# Patient Record
Sex: Male | Born: 1965 | Race: Black or African American | Hispanic: No | Marital: Married | Smoking: Former smoker
Health system: Southern US, Community
[De-identification: ages and names within clinical notes are randomized; demographics above are authoritative.]

## PROBLEM LIST (undated history)

## (undated) DIAGNOSIS — I209 Angina pectoris, unspecified: Secondary | ICD-10-CM

## (undated) DIAGNOSIS — E785 Hyperlipidemia, unspecified: Secondary | ICD-10-CM

## (undated) DIAGNOSIS — E119 Type 2 diabetes mellitus without complications: Secondary | ICD-10-CM

## (undated) DIAGNOSIS — A048 Other specified bacterial intestinal infections: Secondary | ICD-10-CM

## (undated) DIAGNOSIS — K259 Gastric ulcer, unspecified as acute or chronic, without hemorrhage or perforation: Secondary | ICD-10-CM

## (undated) DIAGNOSIS — R011 Cardiac murmur, unspecified: Secondary | ICD-10-CM

## (undated) DIAGNOSIS — L910 Hypertrophic scar: Secondary | ICD-10-CM

## (undated) DIAGNOSIS — I10 Essential (primary) hypertension: Secondary | ICD-10-CM

## (undated) DIAGNOSIS — G4730 Sleep apnea, unspecified: Secondary | ICD-10-CM

## (undated) HISTORY — PX: FRACTURE SURGERY: SHX138

---

## 2005-08-27 ENCOUNTER — Emergency Department: Payer: Self-pay | Admitting: Emergency Medicine

## 2005-12-18 ENCOUNTER — Emergency Department: Payer: Self-pay | Admitting: Emergency Medicine

## 2007-05-02 ENCOUNTER — Emergency Department: Payer: Self-pay | Admitting: Emergency Medicine

## 2007-10-27 ENCOUNTER — Emergency Department: Payer: Self-pay | Admitting: Emergency Medicine

## 2008-02-01 ENCOUNTER — Emergency Department: Payer: Self-pay | Admitting: Emergency Medicine

## 2009-03-02 ENCOUNTER — Emergency Department: Payer: Self-pay | Admitting: Emergency Medicine

## 2009-11-23 ENCOUNTER — Emergency Department: Payer: Self-pay | Admitting: Emergency Medicine

## 2010-04-05 ENCOUNTER — Emergency Department: Payer: Self-pay | Admitting: Emergency Medicine

## 2010-04-18 ENCOUNTER — Emergency Department: Payer: Self-pay | Admitting: Emergency Medicine

## 2010-10-20 ENCOUNTER — Emergency Department: Payer: Self-pay | Admitting: Emergency Medicine

## 2010-11-12 ENCOUNTER — Ambulatory Visit: Payer: Self-pay | Admitting: Family Medicine

## 2010-11-29 ENCOUNTER — Ambulatory Visit: Payer: Self-pay | Admitting: Family Medicine

## 2011-08-11 ENCOUNTER — Emergency Department: Payer: Self-pay | Admitting: Emergency Medicine

## 2011-09-12 ENCOUNTER — Emergency Department: Payer: Self-pay | Admitting: *Deleted

## 2011-12-27 ENCOUNTER — Emergency Department: Payer: Self-pay | Admitting: Emergency Medicine

## 2012-01-01 ENCOUNTER — Emergency Department: Payer: Self-pay | Admitting: *Deleted

## 2012-01-01 LAB — CBC
HGB: 15.4 g/dL (ref 13.0–18.0)
MCH: 30.2 pg (ref 26.0–34.0)
MCHC: 34.1 g/dL (ref 32.0–36.0)
MCV: 89 fL (ref 80–100)
Platelet: 198 10*3/uL (ref 150–440)
RBC: 5.08 10*6/uL (ref 4.40–5.90)
RDW: 13.4 % (ref 11.5–14.5)
WBC: 6.1 10*3/uL (ref 3.8–10.6)

## 2012-01-01 LAB — COMPREHENSIVE METABOLIC PANEL
Albumin: 3.8 g/dL (ref 3.4–5.0)
BUN: 8 mg/dL (ref 7–18)
Bilirubin,Total: 0.3 mg/dL (ref 0.2–1.0)
Calcium, Total: 8.3 mg/dL — ABNORMAL LOW (ref 8.5–10.1)
Chloride: 108 mmol/L — ABNORMAL HIGH (ref 98–107)
Creatinine: 0.94 mg/dL (ref 0.60–1.30)
EGFR (Non-African Amer.): >60
Glucose: 155 mg/dL — ABNORMAL HIGH (ref 65–99)
Osmolality: 285 (ref 275–301)
Potassium: 3.9 mmol/L (ref 3.5–5.1)
SGOT(AST): 30 U/L (ref 15–37)
Sodium: 142 mmol/L (ref 136–145)

## 2012-01-01 LAB — URINALYSIS, COMPLETE
Blood: NEGATIVE
Ketone: NEGATIVE
Nitrite: NEGATIVE
Ph: 5 (ref 4.5–8.0)
Protein: NEGATIVE
RBC,UR: 2 /HPF (ref 0–5)
Squamous Epithelial: 1

## 2012-01-01 LAB — APTT: Activated PTT: 28.1 secs (ref 23.6–35.9)

## 2012-03-11 ENCOUNTER — Emergency Department: Payer: Self-pay | Admitting: *Deleted

## 2012-03-25 ENCOUNTER — Emergency Department: Payer: Self-pay | Admitting: Emergency Medicine

## 2012-03-25 LAB — BASIC METABOLIC PANEL
Chloride: 106 mmol/L (ref 98–107)
Co2: 27 mmol/L (ref 21–32)
EGFR (African American): >60
EGFR (Non-African Amer.): >60
Glucose: 178 mg/dL — ABNORMAL HIGH (ref 65–99)
Osmolality: 285 (ref 275–301)
Potassium: 3.6 mmol/L (ref 3.5–5.1)
Sodium: 141 mmol/L (ref 136–145)

## 2012-03-25 LAB — CBC
HGB: 14.1 g/dL (ref 13.0–18.0)
MCH: 30.8 pg (ref 26.0–34.0)
MCV: 88 fL (ref 80–100)
Platelet: 194 10*3/uL (ref 150–440)
RBC: 4.57 10*6/uL (ref 4.40–5.90)
RDW: 13.5 % (ref 11.5–14.5)
WBC: 8.4 10*3/uL (ref 3.8–10.6)

## 2012-03-25 LAB — CK TOTAL AND CKMB (NOT AT ARMC): CK, Total: 361 U/L — ABNORMAL HIGH (ref 35–232)

## 2012-05-05 ENCOUNTER — Emergency Department: Payer: Self-pay | Admitting: Internal Medicine

## 2012-05-22 ENCOUNTER — Emergency Department: Payer: Self-pay | Admitting: Emergency Medicine

## 2012-05-30 ENCOUNTER — Emergency Department: Payer: Self-pay | Admitting: *Deleted

## 2012-05-30 LAB — CBC
MCH: 30.9 pg (ref 26.0–34.0)
MCHC: 35.3 g/dL (ref 32.0–36.0)
Platelet: 190 10*3/uL (ref 150–440)
RDW: 13 % (ref 11.5–14.5)

## 2012-05-30 LAB — URINALYSIS, COMPLETE
Bacteria: NONE SEEN
Glucose,UR: NEGATIVE mg/dL (ref 0–75)
Ph: 6 (ref 4.5–8.0)
RBC,UR: 1 /HPF (ref 0–5)
Squamous Epithelial: 1
WBC UR: 4 /HPF (ref 0–5)

## 2012-05-30 LAB — CK TOTAL AND CKMB (NOT AT ARMC)
CK, Total: 608 U/L — ABNORMAL HIGH (ref 35–232)
CK-MB: 2.1 ng/mL (ref 0.5–3.6)

## 2012-05-30 LAB — BASIC METABOLIC PANEL
Anion Gap: 11 (ref 7–16)
BUN: 12 mg/dL (ref 7–18)
Calcium, Total: 8.8 mg/dL (ref 8.5–10.1)
Chloride: 107 mmol/L (ref 98–107)
Co2: 23 mmol/L (ref 21–32)
Creatinine: 1.03 mg/dL (ref 0.60–1.30)
Osmolality: 282 (ref 275–301)
Potassium: 3.9 mmol/L (ref 3.5–5.1)

## 2012-08-07 ENCOUNTER — Emergency Department: Payer: Self-pay | Admitting: Emergency Medicine

## 2012-08-25 ENCOUNTER — Emergency Department: Payer: Self-pay | Admitting: Emergency Medicine

## 2012-08-25 LAB — CBC
HCT: 44.1 % (ref 40.0–52.0)
HGB: 15.2 g/dL (ref 13.0–18.0)
MCV: 87 fL (ref 80–100)
RBC: 5.07 10*6/uL (ref 4.40–5.90)
RDW: 12.8 % (ref 11.5–14.5)

## 2012-08-25 LAB — BASIC METABOLIC PANEL
Anion Gap: 8 (ref 7–16)
Calcium, Total: 8.7 mg/dL (ref 8.5–10.1)
Osmolality: 283 (ref 275–301)
Potassium: 3.6 mmol/L (ref 3.5–5.1)

## 2012-10-31 ENCOUNTER — Emergency Department: Payer: Self-pay | Admitting: Emergency Medicine

## 2012-10-31 LAB — URINALYSIS, COMPLETE
Bacteria: NONE SEEN
Bilirubin,UR: NEGATIVE
Blood: NEGATIVE
Glucose,UR: >=500 mg/dL (ref 0–75)
Ketone: NEGATIVE
Nitrite: NEGATIVE
Protein: NEGATIVE

## 2012-10-31 LAB — CBC
HCT: 44.6 % (ref 40.0–52.0)
HGB: 15 g/dL (ref 13.0–18.0)
MCH: 30 pg (ref 26.0–34.0)
MCHC: 33.5 g/dL (ref 32.0–36.0)
MCV: 89 fL (ref 80–100)

## 2012-10-31 LAB — COMPREHENSIVE METABOLIC PANEL
Albumin: 4.1 g/dL (ref 3.4–5.0)
Alkaline Phosphatase: 134 U/L (ref 50–136)
Anion Gap: 7 (ref 7–16)
Calcium, Total: 8.8 mg/dL (ref 8.5–10.1)
Chloride: 104 mmol/L (ref 98–107)
Co2: 26 mmol/L (ref 21–32)
EGFR (African American): >60
EGFR (Non-African Amer.): >60
Glucose: 202 mg/dL — ABNORMAL HIGH (ref 65–99)
Osmolality: 279 (ref 275–301)
Potassium: 3.9 mmol/L (ref 3.5–5.1)
Sodium: 137 mmol/L (ref 136–145)
Total Protein: 8.1 g/dL (ref 6.4–8.2)

## 2012-11-21 ENCOUNTER — Emergency Department: Payer: Self-pay | Admitting: Emergency Medicine

## 2012-11-21 LAB — URINALYSIS, COMPLETE
Bacteria: NONE SEEN
Bilirubin,UR: NEGATIVE
Ketone: NEGATIVE
Ph: 5 (ref 4.5–8.0)
Protein: NEGATIVE
Squamous Epithelial: <1
WBC UR: 7 /HPF (ref 0–5)

## 2012-11-21 LAB — LIPASE, BLOOD: Lipase: 126 U/L (ref 73–393)

## 2012-11-21 LAB — CBC
HCT: 42.9 % (ref 40.0–52.0)
MCHC: 34.2 g/dL (ref 32.0–36.0)
MCV: 88 fL (ref 80–100)
Platelet: 185 10*3/uL (ref 150–440)
RBC: 4.85 10*6/uL (ref 4.40–5.90)

## 2012-11-21 LAB — COMPREHENSIVE METABOLIC PANEL
Albumin: 3.6 g/dL (ref 3.4–5.0)
Anion Gap: 3 — ABNORMAL LOW (ref 7–16)
Chloride: 105 mmol/L (ref 98–107)
Creatinine: 0.98 mg/dL (ref 0.60–1.30)
EGFR (Non-African Amer.): >60
Glucose: 116 mg/dL — ABNORMAL HIGH (ref 65–99)
Osmolality: 276 (ref 275–301)
Potassium: 4.3 mmol/L (ref 3.5–5.1)
SGOT(AST): 31 U/L (ref 15–37)
SGPT (ALT): 54 U/L (ref 12–78)
Sodium: 138 mmol/L (ref 136–145)

## 2012-12-03 ENCOUNTER — Emergency Department: Payer: Self-pay | Admitting: Emergency Medicine

## 2012-12-03 LAB — CBC
HCT: 43.9 % (ref 40.0–52.0)
HGB: 15.1 g/dL (ref 13.0–18.0)
MCHC: 34.4 g/dL (ref 32.0–36.0)
MCV: 88 fL (ref 80–100)
RBC: 4.97 10*6/uL (ref 4.40–5.90)
WBC: 10.5 10*3/uL (ref 3.8–10.6)

## 2012-12-03 LAB — COMPREHENSIVE METABOLIC PANEL
Albumin: 3.8 g/dL (ref 3.4–5.0)
Bilirubin,Total: 0.2 mg/dL (ref 0.2–1.0)
Co2: 22 mmol/L (ref 21–32)
EGFR (Non-African Amer.): >60
Osmolality: 277 (ref 275–301)
Potassium: 3.8 mmol/L (ref 3.5–5.1)
Sodium: 138 mmol/L (ref 136–145)
Total Protein: 7.6 g/dL (ref 6.4–8.2)

## 2012-12-03 LAB — URINALYSIS, COMPLETE
Bacteria: NONE SEEN
Bilirubin,UR: NEGATIVE
Glucose,UR: NEGATIVE mg/dL (ref 0–75)
Nitrite: NEGATIVE
Ph: 6 (ref 4.5–8.0)
WBC UR: <1 /HPF (ref 0–5)

## 2012-12-03 LAB — LIPASE, BLOOD: Lipase: 104 U/L (ref 73–393)

## 2013-02-02 ENCOUNTER — Emergency Department: Payer: Self-pay | Admitting: Emergency Medicine

## 2013-02-02 LAB — BASIC METABOLIC PANEL
Anion Gap: 9 (ref 7–16)
BUN: 14 mg/dL (ref 7–18)
Co2: 26 mmol/L (ref 21–32)
Creatinine: 1 mg/dL (ref 0.60–1.30)
EGFR (African American): >60
EGFR (Non-African Amer.): >60
Osmolality: 288 (ref 275–301)
Sodium: 137 mmol/L (ref 136–145)

## 2013-02-02 LAB — CBC WITH DIFFERENTIAL/PLATELET
Basophil #: 0.1 10*3/uL (ref 0.0–0.1)
Basophil %: 1.1 %
HCT: 41 % (ref 40.0–52.0)
Lymphocyte #: 2.6 10*3/uL (ref 1.0–3.6)
Lymphocyte %: 32 %
MCV: 86 fL (ref 80–100)
Monocyte #: 0.9 x10 3/mm (ref 0.2–1.0)
Monocyte %: 11 %
Neutrophil #: 4.4 10*3/uL (ref 1.4–6.5)
Neutrophil %: 54 %
Platelet: 177 10*3/uL (ref 150–440)
RBC: 4.75 10*6/uL (ref 4.40–5.90)
RDW: 12.7 % (ref 11.5–14.5)
WBC: 8.2 10*3/uL (ref 3.8–10.6)

## 2013-02-02 LAB — URINALYSIS, COMPLETE
Bilirubin,UR: NEGATIVE
Blood: NEGATIVE
Glucose,UR: >=500 mg/dL (ref 0–75)
Ketone: NEGATIVE
Nitrite: NEGATIVE
Ph: 6 (ref 4.5–8.0)
Protein: NEGATIVE
Specific Gravity: 1.025 (ref 1.003–1.030)
Squamous Epithelial: NONE SEEN

## 2013-02-17 ENCOUNTER — Emergency Department: Payer: Self-pay | Admitting: Emergency Medicine

## 2013-02-17 LAB — COMPREHENSIVE METABOLIC PANEL
Alkaline Phosphatase: 207 U/L — ABNORMAL HIGH (ref 50–136)
Anion Gap: 5 — ABNORMAL LOW (ref 7–16)
Calcium, Total: 8.5 mg/dL (ref 8.5–10.1)
Chloride: 100 mmol/L (ref 98–107)
Co2: 29 mmol/L (ref 21–32)
Creatinine: 1.08 mg/dL (ref 0.60–1.30)
EGFR (African American): >60
Osmolality: 287 (ref 275–301)
Total Protein: 7.3 g/dL (ref 6.4–8.2)

## 2013-02-17 LAB — URINALYSIS, COMPLETE
Blood: NEGATIVE
Ketone: NEGATIVE
Nitrite: NEGATIVE
Ph: 6 (ref 4.5–8.0)
Protein: NEGATIVE
RBC,UR: <1 /HPF (ref 0–5)
Specific Gravity: 1.023 (ref 1.003–1.030)
Squamous Epithelial: <1

## 2013-02-17 LAB — CBC
HCT: 40.8 % (ref 40.0–52.0)
HGB: 14 g/dL (ref 13.0–18.0)
MCHC: 34.3 g/dL (ref 32.0–36.0)
Platelet: 164 10*3/uL (ref 150–440)
RBC: 4.66 10*6/uL (ref 4.40–5.90)
RDW: 12.8 % (ref 11.5–14.5)
WBC: 7.4 10*3/uL (ref 3.8–10.6)

## 2013-03-14 ENCOUNTER — Ambulatory Visit: Payer: Self-pay | Admitting: Endocrinology

## 2013-03-22 ENCOUNTER — Ambulatory Visit: Payer: Self-pay | Admitting: Endocrinology

## 2013-05-13 ENCOUNTER — Emergency Department: Payer: Self-pay | Admitting: Emergency Medicine

## 2013-05-13 LAB — CBC
HCT: 43.4 % (ref 40.0–52.0)
HGB: 15.2 g/dL (ref 13.0–18.0)
MCH: 29.7 pg (ref 26.0–34.0)
MCV: 85 fL (ref 80–100)
Platelet: 166 10*3/uL (ref 150–440)
RBC: 5.13 10*6/uL (ref 4.40–5.90)
RDW: 13.2 % (ref 11.5–14.5)
WBC: 7.5 10*3/uL (ref 3.8–10.6)

## 2013-05-13 LAB — URINALYSIS, COMPLETE
Blood: NEGATIVE
Glucose,UR: >=500 mg/dL (ref 0–75)
Leukocyte Esterase: NEGATIVE
Ph: 5 (ref 4.5–8.0)
Protein: NEGATIVE
RBC,UR: NONE SEEN /HPF (ref 0–5)
Specific Gravity: 1.021 (ref 1.003–1.030)
Squamous Epithelial: NONE SEEN
WBC UR: 1 /HPF (ref 0–5)

## 2013-05-13 LAB — COMPREHENSIVE METABOLIC PANEL
Albumin: 4.2 g/dL (ref 3.4–5.0)
Alkaline Phosphatase: 221 U/L — ABNORMAL HIGH (ref 50–136)
BUN: 15 mg/dL (ref 7–18)
Bilirubin,Total: 0.4 mg/dL (ref 0.2–1.0)
Chloride: 101 mmol/L (ref 98–107)
Creatinine: 0.91 mg/dL (ref 0.60–1.30)
EGFR (African American): >60
SGOT(AST): 33 U/L (ref 15–37)
Sodium: 131 mmol/L — ABNORMAL LOW (ref 136–145)
Total Protein: 7.8 g/dL (ref 6.4–8.2)

## 2013-05-27 ENCOUNTER — Emergency Department: Payer: Self-pay | Admitting: Emergency Medicine

## 2013-05-27 LAB — COMPREHENSIVE METABOLIC PANEL
Alkaline Phosphatase: 246 U/L — ABNORMAL HIGH (ref 50–136)
Anion Gap: 8 (ref 7–16)
BUN: 13 mg/dL (ref 7–18)
Bilirubin,Total: 0.4 mg/dL (ref 0.2–1.0)
Calcium, Total: 8.8 mg/dL (ref 8.5–10.1)
Chloride: 98 mmol/L (ref 98–107)
Co2: 24 mmol/L (ref 21–32)
Creatinine: 0.99 mg/dL (ref 0.60–1.30)
EGFR (African American): >60
EGFR (Non-African Amer.): >60
Glucose: 560 mg/dL (ref 65–99)
Osmolality: 287 (ref 275–301)
Potassium: 4.1 mmol/L (ref 3.5–5.1)
SGOT(AST): 20 U/L (ref 15–37)
SGPT (ALT): 42 U/L (ref 12–78)
Sodium: 130 mmol/L — ABNORMAL LOW (ref 136–145)

## 2013-05-27 LAB — CBC
HCT: 44.2 % (ref 40.0–52.0)
MCV: 86 fL (ref 80–100)
Platelet: 170 10*3/uL (ref 150–440)
RBC: 5.12 10*6/uL (ref 4.40–5.90)
WBC: 7.9 10*3/uL (ref 3.8–10.6)

## 2013-05-31 ENCOUNTER — Ambulatory Visit: Payer: Self-pay | Admitting: Family Medicine

## 2013-06-27 ENCOUNTER — Ambulatory Visit: Payer: Self-pay | Admitting: Family Medicine

## 2013-07-04 ENCOUNTER — Emergency Department: Payer: Self-pay | Admitting: Emergency Medicine

## 2013-07-04 LAB — BASIC METABOLIC PANEL
Anion Gap: 8 (ref 7–16)
BUN: 16 mg/dL (ref 7–18)
Calcium, Total: 8.6 mg/dL (ref 8.5–10.1)
Chloride: 100 mmol/L (ref 98–107)
Creatinine: 0.89 mg/dL (ref 0.60–1.30)
EGFR (African American): >60
EGFR (Non-African Amer.): >60
Osmolality: 287 (ref 275–301)

## 2013-07-04 LAB — CBC
HGB: 15.2 g/dL (ref 13.0–18.0)
MCHC: 34.2 g/dL (ref 32.0–36.0)
Platelet: 186 10*3/uL (ref 150–440)
RDW: 12.8 % (ref 11.5–14.5)
WBC: 8.2 10*3/uL (ref 3.8–10.6)

## 2013-07-04 LAB — TROPONIN I
Troponin-I: <0.02 ng/mL
Troponin-I: <0.02 ng/mL

## 2013-07-15 ENCOUNTER — Ambulatory Visit: Payer: Self-pay | Admitting: Family Medicine

## 2013-08-15 ENCOUNTER — Ambulatory Visit: Payer: Self-pay | Admitting: Family Medicine

## 2013-08-24 ENCOUNTER — Emergency Department: Payer: Self-pay | Admitting: Emergency Medicine

## 2013-08-24 LAB — COMPREHENSIVE METABOLIC PANEL
ALK PHOS: 205 U/L — AB
Albumin: 3.7 g/dL (ref 3.4–5.0)
Anion Gap: 3 — ABNORMAL LOW (ref 7–16)
BUN: 12 mg/dL (ref 7–18)
Bilirubin,Total: 0.5 mg/dL (ref 0.2–1.0)
CALCIUM: 8.6 mg/dL (ref 8.5–10.1)
Chloride: 99 mmol/L (ref 98–107)
Co2: 30 mmol/L (ref 21–32)
Creatinine: 1.11 mg/dL (ref 0.60–1.30)
EGFR (African American): >60
GLUCOSE: 485 mg/dL — AB (ref 65–99)
Osmolality: 286 (ref 275–301)
Potassium: 4.9 mmol/L (ref 3.5–5.1)
SGOT(AST): 18 U/L (ref 15–37)
SGPT (ALT): 39 U/L (ref 12–78)
SODIUM: 132 mmol/L — AB (ref 136–145)
TOTAL PROTEIN: 7.6 g/dL (ref 6.4–8.2)

## 2013-08-24 LAB — URINALYSIS, COMPLETE
Bacteria: NONE SEEN
Bilirubin,UR: NEGATIVE
Blood: NEGATIVE
Glucose,UR: >=500 mg/dL (ref 0–75)
Leukocyte Esterase: NEGATIVE
NITRITE: NEGATIVE
PH: 6 (ref 4.5–8.0)
Protein: NEGATIVE
RBC,UR: NONE SEEN /HPF (ref 0–5)
Specific Gravity: 1.023 (ref 1.003–1.030)
WBC UR: 1 /HPF (ref 0–5)

## 2013-08-24 LAB — CBC
HCT: 45.1 % (ref 40.0–52.0)
HGB: 15.4 g/dL (ref 13.0–18.0)
MCH: 29.1 pg (ref 26.0–34.0)
MCHC: 34.2 g/dL (ref 32.0–36.0)
MCV: 85 fL (ref 80–100)
Platelet: 159 10*3/uL (ref 150–440)
RBC: 5.3 10*6/uL (ref 4.40–5.90)
RDW: 13.1 % (ref 11.5–14.5)
WBC: 6.5 10*3/uL (ref 3.8–10.6)

## 2013-09-05 ENCOUNTER — Ambulatory Visit: Payer: Self-pay | Admitting: Family Medicine

## 2013-09-15 ENCOUNTER — Ambulatory Visit: Payer: Self-pay | Admitting: Family Medicine

## 2013-10-06 ENCOUNTER — Emergency Department: Payer: Self-pay | Admitting: Emergency Medicine

## 2013-10-06 LAB — BASIC METABOLIC PANEL
ANION GAP: 6 — AB (ref 7–16)
BUN: 17 mg/dL (ref 7–18)
CALCIUM: 8 mg/dL — AB (ref 8.5–10.1)
CO2: 25 mmol/L (ref 21–32)
Chloride: 99 mmol/L (ref 98–107)
Creatinine: 1.05 mg/dL (ref 0.60–1.30)
EGFR (African American): >60
EGFR (Non-African Amer.): >60
GLUCOSE: 537 mg/dL — AB (ref 65–99)
OSMOLALITY: 287 (ref 275–301)
POTASSIUM: 4 mmol/L (ref 3.5–5.1)
Sodium: 130 mmol/L — ABNORMAL LOW (ref 136–145)

## 2013-10-06 LAB — CBC
HCT: 41.7 % (ref 40.0–52.0)
HGB: 13.9 g/dL (ref 13.0–18.0)
MCH: 29.3 pg (ref 26.0–34.0)
MCHC: 33.3 g/dL (ref 32.0–36.0)
MCV: 88 fL (ref 80–100)
PLATELETS: 153 10*3/uL (ref 150–440)
RBC: 4.75 10*6/uL (ref 4.40–5.90)
RDW: 13.3 % (ref 11.5–14.5)
WBC: 6.9 10*3/uL (ref 3.8–10.6)

## 2013-10-06 LAB — TROPONIN I: Troponin-I: <0.02 ng/mL

## 2013-10-07 LAB — TROPONIN I

## 2013-10-25 ENCOUNTER — Ambulatory Visit: Payer: Self-pay | Admitting: Gastroenterology

## 2013-11-08 DIAGNOSIS — L910 Hypertrophic scar: Secondary | ICD-10-CM | POA: Insufficient documentation

## 2013-11-22 DIAGNOSIS — E785 Hyperlipidemia, unspecified: Secondary | ICD-10-CM | POA: Insufficient documentation

## 2013-11-22 DIAGNOSIS — E119 Type 2 diabetes mellitus without complications: Secondary | ICD-10-CM | POA: Insufficient documentation

## 2013-11-22 DIAGNOSIS — G4733 Obstructive sleep apnea (adult) (pediatric): Secondary | ICD-10-CM | POA: Insufficient documentation

## 2014-01-23 ENCOUNTER — Emergency Department: Payer: Self-pay | Admitting: Emergency Medicine

## 2014-01-23 LAB — URINALYSIS, COMPLETE
BILIRUBIN, UR: NEGATIVE
Bacteria: NONE SEEN
Blood: NEGATIVE
Leukocyte Esterase: NEGATIVE
NITRITE: NEGATIVE
PH: 6 (ref 4.5–8.0)
Protein: NEGATIVE
RBC,UR: NONE SEEN /HPF (ref 0–5)
SQUAMOUS EPITHELIAL: NONE SEEN
Specific Gravity: 1.009 (ref 1.003–1.030)
WBC UR: 1 /HPF (ref 0–5)

## 2014-01-23 LAB — CBC
HCT: 42.8 % (ref 40.0–52.0)
HGB: 14.9 g/dL (ref 13.0–18.0)
MCH: 29.9 pg (ref 26.0–34.0)
MCHC: 34.7 g/dL (ref 32.0–36.0)
MCV: 86 fL (ref 80–100)
Platelet: 168 10*3/uL (ref 150–440)
RBC: 4.97 10*6/uL (ref 4.40–5.90)
RDW: 13.3 % (ref 11.5–14.5)
WBC: 9.2 10*3/uL (ref 3.8–10.6)

## 2014-01-23 LAB — BASIC METABOLIC PANEL
Anion Gap: 5 — ABNORMAL LOW (ref 7–16)
BUN: 14 mg/dL (ref 7–18)
CO2: 27 mmol/L (ref 21–32)
Calcium, Total: 9 mg/dL (ref 8.5–10.1)
Chloride: 104 mmol/L (ref 98–107)
Creatinine: 0.94 mg/dL (ref 0.60–1.30)
EGFR (Non-African Amer.): >60
Glucose: 187 mg/dL — ABNORMAL HIGH (ref 65–99)
OSMOLALITY: 277 (ref 275–301)
Potassium: 3.5 mmol/L (ref 3.5–5.1)
Sodium: 136 mmol/L (ref 136–145)

## 2014-01-23 LAB — MAGNESIUM: MAGNESIUM: 1.6 mg/dL — AB

## 2014-01-27 DIAGNOSIS — R252 Cramp and spasm: Secondary | ICD-10-CM | POA: Insufficient documentation

## 2014-03-08 ENCOUNTER — Emergency Department: Payer: Self-pay | Admitting: Internal Medicine

## 2014-03-08 LAB — CBC
HCT: 41.1 % (ref 40.0–52.0)
HGB: 13.8 g/dL (ref 13.0–18.0)
MCH: 29.8 pg (ref 26.0–34.0)
MCHC: 33.6 g/dL (ref 32.0–36.0)
MCV: 89 fL (ref 80–100)
Platelet: 167 10*3/uL (ref 150–440)
RBC: 4.63 10*6/uL (ref 4.40–5.90)
RDW: 13.2 % (ref 11.5–14.5)
WBC: 6.3 10*3/uL (ref 3.8–10.6)

## 2014-03-08 LAB — URINALYSIS, COMPLETE
BILIRUBIN, UR: NEGATIVE
BLOOD: NEGATIVE
Bacteria: NONE SEEN
Leukocyte Esterase: NEGATIVE
Nitrite: NEGATIVE
PH: 6 (ref 4.5–8.0)
Protein: NEGATIVE
Specific Gravity: 1.03 (ref 1.003–1.030)
WBC UR: 2 /HPF (ref 0–5)

## 2014-03-08 LAB — BASIC METABOLIC PANEL
ANION GAP: 10 (ref 7–16)
BUN: 13 mg/dL (ref 7–18)
Calcium, Total: 8.3 mg/dL — ABNORMAL LOW (ref 8.5–10.1)
Chloride: 103 mmol/L (ref 98–107)
Co2: 21 mmol/L (ref 21–32)
Creatinine: 1.01 mg/dL (ref 0.60–1.30)
EGFR (Non-African Amer.): >60
Glucose: 447 mg/dL — ABNORMAL HIGH (ref 65–99)
Osmolality: 288 (ref 275–301)
POTASSIUM: 3.7 mmol/L (ref 3.5–5.1)
SODIUM: 134 mmol/L — AB (ref 136–145)

## 2014-03-08 LAB — CK TOTAL AND CKMB (NOT AT ARMC)
CK, TOTAL: 1467 U/L — AB
CK-MB: 1.8 ng/mL (ref 0.5–3.6)

## 2014-03-08 LAB — MAGNESIUM: Magnesium: 1.6 mg/dL — ABNORMAL LOW

## 2014-03-08 LAB — TROPONIN I

## 2014-05-23 ENCOUNTER — Emergency Department: Payer: Self-pay | Admitting: Internal Medicine

## 2014-05-23 LAB — URINALYSIS, COMPLETE
BILIRUBIN, UR: NEGATIVE
BLOOD: NEGATIVE
Bacteria: NONE SEEN
GLUCOSE, UR: NEGATIVE mg/dL (ref 0–75)
KETONE: NEGATIVE
LEUKOCYTE ESTERASE: NEGATIVE
Nitrite: NEGATIVE
Ph: 6 (ref 4.5–8.0)
Protein: NEGATIVE
RBC,UR: <1 /HPF (ref 0–5)
SPECIFIC GRAVITY: 1.02 (ref 1.003–1.030)
Squamous Epithelial: 1
WBC UR: 2 /HPF (ref 0–5)

## 2014-05-23 LAB — CBC
HCT: 44.4 % (ref 40.0–52.0)
HGB: 14.2 g/dL (ref 13.0–18.0)
MCH: 28.7 pg (ref 26.0–34.0)
MCHC: 32 g/dL (ref 32.0–36.0)
MCV: 90 fL (ref 80–100)
Platelet: 159 10*3/uL (ref 150–440)
RBC: 4.94 10*6/uL (ref 4.40–5.90)
RDW: 13 % (ref 11.5–14.5)
WBC: 7.3 10*3/uL (ref 3.8–10.6)

## 2014-05-23 LAB — COMPREHENSIVE METABOLIC PANEL
ALK PHOS: 98 U/L
ANION GAP: 6 — AB (ref 7–16)
Albumin: 3.3 g/dL — ABNORMAL LOW (ref 3.4–5.0)
BILIRUBIN TOTAL: 0.5 mg/dL (ref 0.2–1.0)
BUN: 11 mg/dL (ref 7–18)
CALCIUM: 7.8 mg/dL — AB (ref 8.5–10.1)
Chloride: 106 mmol/L (ref 98–107)
Co2: 29 mmol/L (ref 21–32)
Creatinine: 0.86 mg/dL (ref 0.60–1.30)
EGFR (African American): >60
EGFR (Non-African Amer.): >60
Glucose: 144 mg/dL — ABNORMAL HIGH (ref 65–99)
Osmolality: 283 (ref 275–301)
Potassium: 3.9 mmol/L (ref 3.5–5.1)
SGOT(AST): 28 U/L (ref 15–37)
SGPT (ALT): 30 U/L
Sodium: 141 mmol/L (ref 136–145)
Total Protein: 6.9 g/dL (ref 6.4–8.2)

## 2014-05-23 LAB — TSH: THYROID STIMULATING HORM: 0.63 u[IU]/mL

## 2014-05-23 LAB — DRUG SCREEN, URINE

## 2014-05-23 LAB — TROPONIN I: Troponin-I: <0.02 ng/mL

## 2014-05-23 LAB — CK TOTAL AND CKMB (NOT AT ARMC)
CK, Total: 286 U/L
CK-MB: 1.2 ng/mL (ref 0.5–3.6)

## 2014-05-24 ENCOUNTER — Emergency Department: Payer: Self-pay | Admitting: Emergency Medicine

## 2014-05-28 LAB — CULTURE, BLOOD (SINGLE)

## 2014-06-18 ENCOUNTER — Emergency Department: Payer: Self-pay | Admitting: Emergency Medicine

## 2014-07-30 DIAGNOSIS — M722 Plantar fascial fibromatosis: Secondary | ICD-10-CM | POA: Insufficient documentation

## 2014-09-23 ENCOUNTER — Emergency Department: Payer: Self-pay | Admitting: Emergency Medicine

## 2014-10-06 ENCOUNTER — Ambulatory Visit: Payer: Self-pay | Admitting: Internal Medicine

## 2014-10-18 ENCOUNTER — Emergency Department: Payer: Self-pay | Admitting: Emergency Medicine

## 2014-12-01 ENCOUNTER — Emergency Department: Admit: 2014-12-01 | Discharge status: Against Medical Advice | Payer: Self-pay | Admitting: Emergency Medicine

## 2014-12-01 LAB — CBC
HCT: 44.4 % (ref 40.0–52.0)
HGB: 15.4 g/dL (ref 13.0–18.0)
MCH: 29.6 pg (ref 26.0–34.0)
MCHC: 34.6 g/dL (ref 32.0–36.0)
MCV: 86 fL (ref 80–100)
PLATELETS: 170 10*3/uL (ref 150–440)
RBC: 5.19 10*6/uL (ref 4.40–5.90)
RDW: 13.1 % (ref 11.5–14.5)
WBC: 7.6 10*3/uL (ref 3.8–10.6)

## 2014-12-01 LAB — BASIC METABOLIC PANEL
ANION GAP: 11 (ref 7–16)
BUN: 14 mg/dL
Calcium, Total: 9.1 mg/dL
Chloride: 102 mmol/L
Co2: 21 mmol/L — ABNORMAL LOW
Creatinine: 0.94 mg/dL
GLUCOSE: 338 mg/dL — AB
Potassium: 3.3 mmol/L — ABNORMAL LOW
Sodium: 134 mmol/L — ABNORMAL LOW

## 2014-12-01 LAB — TROPONIN I

## 2015-02-23 ENCOUNTER — Emergency Department
Admission: EM | Admit: 2015-02-23 | Discharge: 2015-02-23 | Disposition: A | Discharge status: Home / Self Care | Payer: BLUE CROSS/BLUE SHIELD | Attending: Emergency Medicine | Admitting: Emergency Medicine

## 2015-02-23 ENCOUNTER — Encounter: Payer: Self-pay | Admitting: *Deleted

## 2015-02-23 DIAGNOSIS — X58XXXA Exposure to other specified factors, initial encounter: Secondary | ICD-10-CM | POA: Diagnosis not present

## 2015-02-23 DIAGNOSIS — Y9289 Other specified places as the place of occurrence of the external cause: Secondary | ICD-10-CM | POA: Diagnosis not present

## 2015-02-23 DIAGNOSIS — Z87891 Personal history of nicotine dependence: Secondary | ICD-10-CM | POA: Diagnosis not present

## 2015-02-23 DIAGNOSIS — T1591XA Foreign body on external eye, part unspecified, right eye, initial encounter: Secondary | ICD-10-CM | POA: Insufficient documentation

## 2015-02-23 DIAGNOSIS — Y9389 Activity, other specified: Secondary | ICD-10-CM | POA: Diagnosis not present

## 2015-02-23 DIAGNOSIS — E119 Type 2 diabetes mellitus without complications: Secondary | ICD-10-CM | POA: Diagnosis not present

## 2015-02-23 DIAGNOSIS — Z79899 Other long term (current) drug therapy: Secondary | ICD-10-CM | POA: Insufficient documentation

## 2015-02-23 DIAGNOSIS — Y998 Other external cause status: Secondary | ICD-10-CM | POA: Diagnosis not present

## 2015-02-23 DIAGNOSIS — Z794 Long term (current) use of insulin: Secondary | ICD-10-CM | POA: Insufficient documentation

## 2015-02-23 HISTORY — DX: Type 2 diabetes mellitus without complications: E11.9

## 2015-02-23 HISTORY — DX: Cardiac murmur, unspecified: R01.1

## 2015-02-23 MED ORDER — ERYTHROMYCIN 5 MG/GM OP OINT
TOPICAL_OINTMENT | Freq: Once | OPHTHALMIC | Status: AC
  Administered 2015-02-23: 1 via OPHTHALMIC
  Filled 2015-02-23: qty 1

## 2015-02-23 MED ORDER — TETRACAINE HCL 0.5 % OP SOLN
2.0000 [drp] | Freq: Once | OPHTHALMIC | Status: AC
  Administered 2015-02-23: 2 [drp] via OPHTHALMIC
  Filled 2015-02-23: qty 2

## 2015-02-23 MED ORDER — FLUORESCEIN SODIUM 1 MG OP STRP
1.0000 | ORAL_STRIP | Freq: Once | OPHTHALMIC | Status: AC
  Administered 2015-02-23: 1 via OPHTHALMIC
  Filled 2015-02-23: qty 1

## 2015-02-23 NOTE — ED Provider Notes (Signed)
[facility] Emergency Department Provider Note  ____________________________________________  Time seen: 2312  I have reviewed the triage vital signs and the nursing notes.   HISTORY  Chief Complaint Foreign Body in Eye     HPI Miguel Walters is a 49 y.o. male who reports he has the sensation of having a foreign body in his right eye. He was lying in bed when he says he thinks some sort of "grit" got in the eye. He was not doing anything specific or exposed to any particular foreign body or contaminant. This began this evening. 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 The pain has continued. He has been rubbing his eye. He reports he feels as though the foreign body is still present.  He reports some blurriness with the right eye.    Past Medical History  Diagnosis Date  . Diabetes mellitus without complication   . Heart murmur     There are no active problems to display for this patient.   History reviewed. No pertinent past surgical history.  Current Outpatient Rx  Name  Route  Sig  Dispense  Refill  . cholecalciferol (VITAMIN D) 1000 UNITS tablet   Oral   Take 1,000 Units by mouth daily.         . insulin detemir (LEVEMIR) 100 UNIT/ML injection   Subcutaneous   Inject into the skin at bedtime.         . insulin lispro (HUMALOG) 100 UNIT/ML injection   Subcutaneous   Inject into the skin 3 (three) times daily before meals.         Marland Kitchen lisinopril (PRINIVIL,ZESTRIL) 10 MG tablet   Oral   Take 10 mg by mouth daily.         . metFORMIN (GLUCOPHAGE) 500 MG tablet   Oral   Take 500 mg by mouth 2 (two) times daily with a meal.           Allergies Hydrocodone  History reviewed. No pertinent family history.  Social History History  Substance Use Topics  . Smoking status: Former Smoker    Quit date: 02/23/2007  . Smokeless tobacco: Not on file  . Alcohol Use: No    Review of  Systems  Constitutional: Negative for fever. ENT: Foreign body sensation to right eye. Cardiovascular: Negative for chest pain. Respiratory: Negative for shortness of breath. Gastrointestinal: Negative for abdominal pain, vomiting and diarrhea. Genitourinary: Negative for dysuria. Musculoskeletal: No myalgias or injuries. Skin: Negative for rash. Neurological: Negative for headaches   10-point ROS otherwise negative.  ____________________________________________   PHYSICAL EXAM:  VITAL SIGNS: ED Triage Vitals  Enc Vitals Group     BP 02/23/15 2219 133/80 mmHg     Pulse Rate 02/23/15 2219 50     Resp 02/23/15 2219 16     Temp 02/23/15 2219 98.1 F (36.7 C)     Temp Source 02/23/15 2219 Oral     SpO2 --      Weight 02/23/15 2219 192 lb (87.091 kg)     Height 02/23/15 2219  (1.702 m)     Head Cir --      Peak Flow --      Pain Score 02/23/15 2220 10     Pain Loc --      Pain Edu? --      Excl. in GC? --     Constitutional: Alert and oriented. A little uncomfortable due to the irritation to  his right eye, otherwise no acute distress. ENT   Head: Normocephalic and atraumatic.      Eye:       Visual acuity: Right eye 20/25 left eye 20/50.      Gross exam: Mild chemosis to the most lateral aspects of the right eye. No foreign body seen. No hyphema       Slit lamp exam:  No focal ulceration or corneal abrasion noted.         Nose: No congestion/rhinnorhea.   Mouth/Throat: Mucous membranes are moist. Cardiovascular: Normal rate, regular rhythm, no murmur noted Respiratory:  Normal respiratory effort, no tachypnea.    Breath sounds are clear and equal bilaterally.  Neurologic:  Normal speech and language. No gross focal neurologic deficits are appreciated.  Skin:  Skin is warm, dry. No rash noted. Psychiatric: Mood and affect are normal. Speech and behavior are normal.  ____________________________________________    PROCEDURES  Slit lamp exam. See  exam findings above.  ____________________________________________   INITIAL IMPRESSION / ASSESSMENT AND PLAN / ED COURSE  Pertinent labs & imaging results that were available during my care of the patient were reviewed by me and considered in my medical decision making (see chart for details).  With full exam of the eye and underneath the eyelids, including eversion of the eyelids, and through slit lamp exam, we found no foreign body. The surface of the eye appears diffusely edematous with minimal to mild chemosis. I believe this is due to the patient rubbing his has eye.  Patient had very good pain control after tetracaine. We are applying erythromycin ophthalmic. I have counseled him to follow-up with [facility] tomorrow.  Patient has been very humorous and pleasant. He is appreciative of care. No acute distress this time.  ____________________________________________   FINAL CLINICAL IMPRESSION(S) / ED DIAGNOSES  Final diagnoses:  Foreign body, eye, right, initial encounter      Darien Ramusavid W Kelbie Moro, MD 02/23/15 2336

## 2015-02-23 NOTE — Discharge Instructions (Signed)
We did not see a foreign body in your eye currently. We looked under the upper and lower lid. We did a florist seen stained slit-lamp exam and did not see signs of a significant abrasion. You do have signs of some irritation and mild swelling of the surface of the eye. Use the antibiotic. Follow with [facility]. Return to the emergency department if you have any further urgent concerns.  Eye, Foreign Body The term foreign body refers to any object near, on the surface of or in the eye that should not be there. A foreign body may be a small speck of dirt or dust, a hair or eyelash, a splinter or any object. CAUSES  Foreign bodies can get in the eye by:  Flying pieces of something that was broken or destroyed (debris).  A sudden injury (trauma) to the eye. SYMPTOMS  Symptoms depend on what the foreign body is and where it is in the eye. The most common locations are:  On the inner surface of the upper or lower eyelids or on the covering of the white part of the eye (conjunctiva). Symptoms in this location are:  Irritating and painful, especially when blinking.  Feeling like something is in the eye.  On the surface of the clear covering on the front of the eye (cornea). A corneal foreign body has symptoms that:  Are painful and irritating since the cornea is very sensitive.  Form small "rust rings" around a metallic foreign body. Metallic foreign bodies stick more firmly to the surface of the cornea.  Inside the eyeball. Infection can happen fast and can be hard to treat with antibiotics. This is an extremely dangerous situation. Foreign bodies inside the eye can threaten vision. A person may even loose their eye. Foreign bodies inside the eye may cause:  Great pain.  Immediate loss of vision. DIAGNOSIS  Foreign bodies are found during an exam by an eye specialist. Those that are on the eyelids, conjunctiva or cornea are usually (but not always) easily found. When a foreign body  is inside the eyeball, a cataract may form almost right away. This makes it hard for an ophthalmologist to find the foreign body. Special tests may be needed, including ultrasound testing, X-rays and CT scans. TREATMENT   Foreign bodies that are on the eyelids, conjunctiva or cornea are often removed easily and painlessly.  If the foreign body has caused a scratch or abrasion of the cornea, antibiotic drops, ointments and/or a tight patch called a "pressure patch" may be needed. Follow-up exams will be needed for several days until the abrasion heals.  Surgery is needed right away if the foreign body is inside the eyeball. This is a medical emergency. An antibiotic therapy will likely be given to stop an infection. HOME CARE INSTRUCTIONS  The use of eye patches is not universal. Their use varies from state to state and from caregiver to caregiver. If an eye patch was applied:  Keep the eye patch on for as long as directed by your caregiver until the follow-up appointment.  Do not remove the patch to put in medications unless instructed to do so. When replacing the patch, retape it as it was before. Follow the same procedure if the patch becomes loose.  WARNING: Do not drive or operate machinery while the eye is patched. The ability to judge distances will be impaired.  Only take over-the-counter or prescription medicines for pain, discomfort or fever as directed by the caregiver. If no  eye patch was applied:  Keep the eye closed as much as possible. Do not rub the eye.  Wear dark glasses as needed to protect the eyes from bright light.  Do not wear contact lenses until the eye feels normal again, or as instructed.  Wear protective eye covering if there is a risk of eye injury. This is important when working with high speed tools.  Only take over-the-counter or prescription medicines for pain, discomfort or fever as directed by the caregiver. SEEK IMMEDIATE MEDICAL CARE IF:   Pain  increases in the eye or the vision changes.  You or your child has problems with the eye patch.  The injury to the eye appears to be getting larger.  There is discharge from the injured eye.  Swelling and/or soreness (inflammation) develops around the affected eye.  You or your child has an oral temperature above 102 F (38.9 C), not controlled by medicine.  Your baby is older than 3 months with a rectal temperature of 102 F (38.9 C) or higher.  Your baby is 64 months old or younger with a rectal temperature of 100.4 F (38 C) or higher. MAKE SURE YOU:   Understand these instructions.  Will watch your condition.  Will get help right away if you are not doing well or get worse. Document Released: 08/01/2005 Document Revised: 10/24/2011 Document Reviewed: 12/27/2012 [facility] Patient Information 2015 [place], [place]. This information is not intended to replace advice given to you by your health care provider. Make sure you discuss any questions you have with your health care provider.

## 2015-02-23 NOTE — ED Notes (Signed)
Pt reports foreign body sensation to rt eye x3 hrs - pt has rubbed his eye and admits eye soreness as well. No drainage.

## 2015-03-29 ENCOUNTER — Emergency Department: Payer: BLUE CROSS/BLUE SHIELD

## 2015-03-29 ENCOUNTER — Emergency Department
Admission: EM | Admit: 2015-03-29 | Discharge: 2015-03-29 | Disposition: A | Discharge status: Home / Self Care | Payer: BLUE CROSS/BLUE SHIELD | Attending: Emergency Medicine | Admitting: Emergency Medicine

## 2015-03-29 ENCOUNTER — Encounter: Payer: Self-pay | Admitting: Emergency Medicine

## 2015-03-29 DIAGNOSIS — M25572 Pain in left ankle and joints of left foot: Secondary | ICD-10-CM | POA: Diagnosis present

## 2015-03-29 DIAGNOSIS — Z87891 Personal history of nicotine dependence: Secondary | ICD-10-CM | POA: Diagnosis not present

## 2015-03-29 DIAGNOSIS — Z794 Long term (current) use of insulin: Secondary | ICD-10-CM | POA: Diagnosis not present

## 2015-03-29 DIAGNOSIS — E119 Type 2 diabetes mellitus without complications: Secondary | ICD-10-CM | POA: Insufficient documentation

## 2015-03-29 DIAGNOSIS — Z79899 Other long term (current) drug therapy: Secondary | ICD-10-CM | POA: Insufficient documentation

## 2015-03-29 DIAGNOSIS — M779 Enthesopathy, unspecified: Secondary | ICD-10-CM | POA: Diagnosis not present

## 2015-03-29 DIAGNOSIS — M7732 Calcaneal spur, left foot: Secondary | ICD-10-CM

## 2015-03-29 MED ORDER — MELOXICAM 15 MG PO TABS: 15.0000 mg | ORAL_TABLET | Freq: Every day | ORAL | Status: DC

## 2015-03-29 NOTE — Discharge Instructions (Signed)
Heel Spur A heel spur is a hook of bone that can form on the calcaneus (the heel bone and the largest bone of the foot). Heel spurs are often associated with plantar fasciitis and usually come in people who have had the problem for an extended period of time. The cause of the relationship is unknown. The pain associated with them is thought to be caused by an inflammation (soreness and redness) of the plantar fascia rather than the spur itself. The plantar fascia is a thick fibrous like tissue that runs from the calcaneus (heel bone) to the ball of the foot. This strong, tight tissue helps maintain the arch of your foot. It helps distribute the weight across your foot as you walk or run. Stresses placed on the plantar fascia can be tremendous. When it is inflamed normal activities become painful. Pain is worse in the morning after sleeping. After sleeping the plantar fascia is tight. The first movements stretch the fascia and this causes pain. As the tendon loosens, the pain usually gets better. It often returns with too much standing or walking.  About 70% of patients with plantar fasciitis have a heel spur. About half of people without foot pain also have heel spurs. DIAGNOSIS  The diagnosis of a heel spur is made by X-ray. The X-ray shows a hook of bone protruding from the bottom of the calcaneus at the point where the plantar fascia is attached to the heel bone.  TREATMENT  It is necessary to find out what is causing the stretching of the plantar fascia. If the cause is over-pronation (flat feet), orthotics and proper foot ware may help.  Stretching exercises, losing weight, wearing shoes that have a cushioned heel that absorbs shock, and elevating the heel with the use of a heel cradle, heel cup, or orthotics may all help. Heel cradles and heel cups provide extra comfort and cushion to the heel, and reduce the amount of shock to the sore area. AVOIDING THE PAIN OF PLANTAR FASCIITIS AND HEEL  SPURS  Consult a sports medicine professional before beginning a new exercise program.  Walking programs offer a good workout. There is a lower chance of overuse injuries common to the runners. There is less impact and less jarring of the joints.  Begin all new exercise programs slowly. If problems or pains develop, decrease the amount of time or distance until you are at a comfortable level.  Wear good shoes and replace them regularly.  Stretch your foot and the heel cords at the back of the ankle (Achilles tendons) both before and after exercise.  Run or exercise on even surfaces that are not hard. For example, asphalt is better than pavement.  Do not run barefoot on hard surfaces.  If using a treadmill, vary the incline.  Do not continue to workout if you have foot or joint problems. Seek professional help if they do not improve. HOME CARE INSTRUCTIONS   Avoid activities that cause you pain until you recover.  Use ice or cold packs to the problem or painful areas after working out.  Only take over-the-counter or prescription medicines for pain, discomfort, or fever as directed by your caregiver.  Soft shoe inserts or athletic shoes with air or gel sole cushions may be helpful.  If problems continue or become more severe, consult a sports medicine caregiver. Cortisone is a potent anti-inflammatory medication that may be injected into the painful area. You can discuss this treatment with your caregiver. MAKE SURE YOU:     Understand these instructions.  Will watch your condition.  Will get help right away if you are not doing well or get worse. Document Released: 09/07/2005 Document Revised: 10/24/2011 Document Reviewed: 10/02/2013 [employer] Patient Information 2015 [employer]. This information is not intended to replace advice given to you by your health care provider. Make sure you discuss any questions you have with your health care provider.  

## 2015-03-29 NOTE — ED Provider Notes (Signed)
[facility] Emergency Department Provider Note ____________________________________________  Time seen: Approximately 4:06 PM  I have reviewed the triage vital signs and the nursing notes.   HISTORY  Chief Complaint Foot Pain   HPI FATE CASTER is a 49 y.o. male who presents to the emergency department for evaluation of left heel pain that has been present for "a long time." He has not taken anything for pain. He has been evaluated by his PCP, but "nothing really has been done to help."   Past Medical History  Diagnosis Date  . Diabetes mellitus without complication   . Heart murmur     There are no active problems to display for this patient.   History reviewed. No pertinent past surgical history.  Current Outpatient Rx  Name  Route  Sig  Dispense  Refill  . cholecalciferol (VITAMIN D) 1000 UNITS tablet   Oral   Take 1,000 Units by mouth daily.         . insulin detemir (LEVEMIR) 100 UNIT/ML injection   Subcutaneous   Inject into the skin at bedtime.         . insulin lispro (HUMALOG) 100 UNIT/ML injection   Subcutaneous   Inject into the skin 3 (three) times daily before meals.         Marland Kitchen lisinopril (PRINIVIL,ZESTRIL) 10 MG tablet   Oral   Take 10 mg by mouth daily.         . meloxicam (MOBIC) 15 MG tablet   Oral   Take 1 tablet (15 mg total) by mouth daily.   30 tablet   2   . metFORMIN (GLUCOPHAGE) 500 MG tablet   Oral   Take 500 mg by mouth 2 (two) times daily with a meal.           Allergies Hydrocodone  History reviewed. No pertinent family history.  Social History Social History  Substance Use Topics  . Smoking status: Former Smoker    Quit date: 02/23/2007  . Smokeless tobacco: None  . Alcohol Use: No    Review of Systems Constitutional: No recent illness. Eyes: No visual changes. ENT: No sore throat. Cardiovascular: Denies chest pain or palpitations. Respiratory: Denies shortness of  breath. Gastrointestinal: No abdominal pain.  Genitourinary: Negative for dysuria. Musculoskeletal: Pain in left heel. Skin: Negative for rash. Neurological: Negative for headaches, focal weakness or numbness. 10-point ROS otherwise negative.  ____________________________________________   PHYSICAL EXAM:  VITAL SIGNS: ED Triage Vitals  Enc Vitals Group     BP 03/29/15 1447 135/74 mmHg     Pulse Rate 03/29/15 1447 69     Resp 03/29/15 1447 20     Temp 03/29/15 1447 98.2 F (36.8 C)     Temp Source 03/29/15 1447 Oral     SpO2 03/29/15 1447 96 %     Weight 03/29/15 1447 200 lb (90.719 kg)     Height 03/29/15 1447 5\' 7"  (1.702 m)     Head Cir --      Peak Flow --      Pain Score 03/29/15 1448 7     Pain Loc --      Pain Edu? --      Excl. in GC? --     Constitutional: Alert and oriented. Well appearing and in no acute distress. Eyes: Conjunctivae are normal. EOMI. Head: Atraumatic. Nose: No congestion/rhinnorhea. Neck: No stridor.  Respiratory: Normal respiratory effort.   Musculoskeletal: Pain in the plantar aspect of the left  heel without swelling or bruising.  Neurologic:  Normal speech and language. No gross focal neurologic deficits are appreciated. Speech is normal. No gait instability. Skin:  Skin is warm, dry and intact. Atraumatic. Psychiatric: Mood and affect are normal. Speech and behavior are normal.  ____________________________________________   LABS (all labs ordered are listed, but only abnormal results are displayed)  Labs Reviewed - No data to display ____________________________________________  RADIOLOGY  Heel spur, otherwise no acute abnormality.  I, Kem Boroughs, personally viewed and evaluated these images as part of my medical decision making.   ____________________________________________   PROCEDURES  Procedure(s) performed: None   ____________________________________________   INITIAL IMPRESSION / ASSESSMENT AND PLAN / ED  COURSE  Pertinent labs & imaging results that were available during my care of the patient were reviewed by me and considered in my medical decision making (see chart for details).  Patient was advised to follow up with podiatry. He was advised to wear shoes with good insoles. He was advised to return to the ER for symptoms that change or worsen if you are unable to see your PCP or the podiatrist. ____________________________________________   FINAL CLINICAL IMPRESSION(S) / ED DIAGNOSES  Final diagnoses:  Heel spur, left       Chinita Pester, FNP 03/29/15 1611  Sharyn Creamer, MD 03/30/15 [phone]

## 2015-03-29 NOTE — ED Notes (Signed)
Patient to ED with c/o left foot/heel pain that has been going on for about a month. Patient reports seeing his PMD for same but nothing has been done. Patient questions whether he may have heel spurs.

## 2015-05-30 ENCOUNTER — Encounter: Payer: Self-pay | Admitting: Emergency Medicine

## 2015-05-30 ENCOUNTER — Emergency Department
Admission: EM | Admit: 2015-05-30 | Discharge: 2015-05-30 | Disposition: A | Discharge status: Home / Self Care | Payer: BLUE CROSS/BLUE SHIELD | Attending: Emergency Medicine | Admitting: Emergency Medicine

## 2015-05-30 DIAGNOSIS — K2970 Gastritis, unspecified, without bleeding: Secondary | ICD-10-CM | POA: Diagnosis not present

## 2015-05-30 DIAGNOSIS — Z87891 Personal history of nicotine dependence: Secondary | ICD-10-CM | POA: Diagnosis not present

## 2015-05-30 DIAGNOSIS — Z794 Long term (current) use of insulin: Secondary | ICD-10-CM | POA: Insufficient documentation

## 2015-05-30 DIAGNOSIS — K625 Hemorrhage of anus and rectum: Secondary | ICD-10-CM | POA: Diagnosis present

## 2015-05-30 DIAGNOSIS — Z79899 Other long term (current) drug therapy: Secondary | ICD-10-CM | POA: Diagnosis not present

## 2015-05-30 DIAGNOSIS — E119 Type 2 diabetes mellitus without complications: Secondary | ICD-10-CM | POA: Diagnosis not present

## 2015-05-30 DIAGNOSIS — Z791 Long term (current) use of non-steroidal anti-inflammatories (NSAID): Secondary | ICD-10-CM | POA: Insufficient documentation

## 2015-05-30 LAB — CBC
HCT: 42.3 % (ref 40.0–52.0)
HEMOGLOBIN: 15.1 g/dL (ref 13.0–18.0)
MCH: 31 pg (ref 26.0–34.0)
MCHC: 35.7 g/dL (ref 32.0–36.0)
MCV: 87 fL (ref 80.0–100.0)
Platelets: 165 10*3/uL (ref 150–440)
RBC: 4.87 MIL/uL (ref 4.40–5.90)
RDW: 13.3 % (ref 11.5–14.5)
WBC: 6 10*3/uL (ref 3.8–10.6)

## 2015-05-30 LAB — COMPREHENSIVE METABOLIC PANEL
ALT: 21 U/L (ref 17–63)
AST: 22 U/L (ref 15–41)
Albumin: 4.4 g/dL (ref 3.5–5.0)
Alkaline Phosphatase: 177 U/L — ABNORMAL HIGH (ref 38–126)
Anion gap: 10 (ref 5–15)
BUN: 10 mg/dL (ref 6–20)
CO2: 24 mmol/L (ref 22–32)
Calcium: 9.6 mg/dL (ref 8.9–10.3)
Chloride: 102 mmol/L (ref 101–111)
Creatinine, Ser: 1.02 mg/dL (ref 0.61–1.24)
GFR calc Af Amer: >60 mL/min (ref >60)
GFR calc non Af Amer: >60 mL/min (ref >60)
Glucose, Bld: 454 mg/dL — ABNORMAL HIGH (ref 65–99)
Potassium: 3.9 mmol/L (ref 3.5–5.1)
Sodium: 136 mmol/L (ref 135–145)
Total Bilirubin: 0.3 mg/dL (ref 0.3–1.2)
Total Protein: 7.3 g/dL (ref 6.5–8.1)

## 2015-05-30 LAB — URINALYSIS COMPLETE WITH MICROSCOPIC (ARMC ONLY)
Bacteria, UA: NONE SEEN
Bilirubin Urine: NEGATIVE
Glucose, UA: >500 mg/dL — AB
Hgb urine dipstick: NEGATIVE
LEUKOCYTES UA: NEGATIVE
Nitrite: NEGATIVE
PROTEIN: NEGATIVE mg/dL
Specific Gravity, Urine: 1.022 (ref 1.005–1.030)
pH: 5 (ref 5.0–8.0)

## 2015-05-30 LAB — LIPASE, BLOOD: LIPASE: 25 U/L (ref 22–51)

## 2015-05-30 MED ORDER — PANTOPRAZOLE SODIUM 40 MG PO TBEC: 40.0000 mg | DELAYED_RELEASE_TABLET | Freq: Every day | ORAL | Status: DC

## 2015-05-30 MED ORDER — GI COCKTAIL ~~LOC~~
30.0000 mL | Freq: Once | ORAL | Status: AC
  Administered 2015-05-30: 30 mL via ORAL
  Filled 2015-05-30: qty 30

## 2015-05-30 MED ORDER — ACETAMINOPHEN 160 MG/5ML PO SUSP
ORAL | Status: AC
  Filled 2015-05-30: qty 15

## 2015-05-30 MED ORDER — SUCRALFATE 1 G PO TABS: 1.0000 g | ORAL_TABLET | Freq: Four times a day (QID) | ORAL | Status: DC

## 2015-05-30 NOTE — Discharge Instructions (Signed)
Please seek medical attention for any high fevers, chest pain, shortness of breath, change in behavior, persistent vomiting, bloody stool or any other new or concerning symptoms. ° ° °Gastritis, Adult °Gastritis is soreness and swelling (inflammation) of the lining of the stomach. Gastritis can develop as a sudden onset (acute) or long-term (chronic) condition. If gastritis is not treated, it can lead to stomach bleeding and ulcers. °CAUSES  °Gastritis occurs when the stomach lining is weak or damaged. Digestive juices from the stomach then inflame the weakened stomach lining. The stomach lining may be weak or damaged due to viral or bacterial infections. One common bacterial infection is the Helicobacter pylori infection. Gastritis can also result from excessive alcohol consumption, taking certain medicines, or having too much acid in the stomach.  °SYMPTOMS  °In some cases, there are no symptoms. When symptoms are present, they may include: °· Pain or a burning sensation in the upper abdomen. °· Nausea. °· Vomiting. °· An uncomfortable feeling of fullness after eating. °DIAGNOSIS  °Your caregiver may suspect you have gastritis based on your symptoms and a physical exam. To determine the cause of your gastritis, your caregiver may perform the following: °· Blood or stool tests to check for the H pylori bacterium. °· Gastroscopy. A thin, flexible tube (endoscope) is passed down the esophagus and into the stomach. The endoscope has a light and camera on the end. Your caregiver uses the endoscope to view the inside of the stomach. °· Taking a tissue sample (biopsy) from the stomach to examine under a microscope. °TREATMENT  °Depending on the cause of your gastritis, medicines may be prescribed. If you have a bacterial infection, such as an H pylori infection, antibiotics may be given. If your gastritis is caused by too much acid in the stomach, H2 blockers or antacids may be given. Your caregiver may recommend that  you stop taking aspirin, ibuprofen, or other nonsteroidal anti-inflammatory drugs (NSAIDs). °HOME CARE INSTRUCTIONS °· Only take over-the-counter or prescription medicines as directed by your caregiver. °· If you were given antibiotic medicines, take them as directed. Finish them even if you start to feel better. °· Drink enough fluids to keep your urine clear or pale yellow. °· Avoid foods and drinks that make your symptoms worse, such as: °¨ Caffeine or alcoholic drinks. °¨ Chocolate. °¨ Peppermint or mint flavorings. °¨ Garlic and onions. °¨ Spicy foods. °¨ Citrus fruits, such as oranges, lemons, or limes. °¨ Tomato-based foods such as sauce, chili, salsa, and pizza. °¨ Fried and fatty foods. °· Eat small, frequent meals instead of large meals. °SEEK IMMEDIATE MEDICAL CARE IF:  °· You have black or dark red stools. °· You vomit blood or material that looks like coffee grounds. °· You are unable to keep fluids down. °· Your abdominal pain gets worse. °· You have a fever. °· You do not feel better after 1 week. °· You have any other questions or concerns. °MAKE SURE YOU: °· Understand these instructions. °· Will watch your condition. °· Will get help right away if you are not doing well or get worse. °  °This information is not intended to replace advice given to you by your health care provider. Make sure you discuss any questions you have with your health care provider. °  °Document Released: 07/26/2001 Document Revised: 01/31/2012 Document Reviewed: 09/14/2011 °Elsevier Interactive Patient Education ©2016 [employer] ° °

## 2015-05-30 NOTE — ED Notes (Signed)
Patient comes with complaints of abdominal pain.  Per Patient he been nauseated on and off since Monday and started having black stools yesterday with cramping.  Patient also reports shortness of breath with normal activity and intermittent blurred vision.

## 2015-05-30 NOTE — ED Notes (Signed)
Nurse present for hemoccult rectal exam that was completed by Dr. Derrill KayGoodman.

## 2015-05-30 NOTE — ED Provider Notes (Signed)
[facility] Emergency Department Provider Note    ____________________________________________  Time seen: 0815  I have reviewed the triage vital signs and the nursing notes.   HISTORY  Chief Complaint Rectal Bleeding   History limited by: Not Limited   HPI Miguel Walters is a 49 y.o. male who presents to the emergency department today with concerns for black stools. He states that he has noticed black stools for the past 3 days. He also has had some abdominal discomfort. He describes it as a sharp pain. It is located in his epigastric region. It has been constant since it started. It is been going on for roughly 1 week. He states that the abdominal discomfort started when he was told to double his metformin dose by his diabetes doctor. He has since cut back to his normal dose. He has had some associated nausea but no actual vomiting. He denies any fevers.   Past Medical History  Diagnosis Date  . Diabetes mellitus without complication (HCC)   . Heart murmur     There are no active problems to display for this patient.   History reviewed. No pertinent past surgical history.  Current Outpatient Rx  Name  Route  Sig  Dispense  Refill  . cholecalciferol (VITAMIN D) 1000 UNITS tablet   Oral   Take 1,000 Units by mouth daily.         . insulin detemir (LEVEMIR) 100 UNIT/ML injection   Subcutaneous   Inject into the skin at bedtime.         . insulin lispro (HUMALOG) 100 UNIT/ML injection   Subcutaneous   Inject into the skin 3 (three) times daily before meals.         Marland Kitchen lisinopril (PRINIVIL,ZESTRIL) 10 MG tablet   Oral   Take 10 mg by mouth daily.         . meloxicam (MOBIC) 15 MG tablet   Oral   Take 1 tablet (15 mg total) by mouth daily.   30 tablet   2   . metFORMIN (GLUCOPHAGE) 500 MG tablet   Oral   Take 500 mg by mouth 2 (two) times daily with a meal.           Allergies Hydrocodone  History reviewed. No pertinent  family history.  Social History Social History  Substance Use Topics  . Smoking status: Former Smoker    Quit date: 02/23/2007  . Smokeless tobacco: None  . Alcohol Use: No    Review of Systems  Constitutional: Negative for fever. Cardiovascular: Negative for chest pain. Respiratory: Negative for shortness of breath. Gastrointestinal: Positive for abdominal pain and nausea. Positive for black stools. Genitourinary: Negative for dysuria. Musculoskeletal: Negative for back pain. Skin: Negative for rash. Neurological: Negative for headaches, focal weakness or numbness.   10-point ROS otherwise negative.  ____________________________________________   PHYSICAL EXAM:  VITAL SIGNS: ED Triage Vitals  Enc Vitals Group     BP 05/30/15 0735 152/95 mmHg     Pulse Rate 05/30/15 0735 72     Resp 05/30/15 0735 18     Temp 05/30/15 0735 98.3 F (36.8 C)     Temp Source 05/30/15 0735 Oral     SpO2 05/30/15 0735 97 %     Weight 05/30/15 0735 200 lb (90.719 kg)     Height 05/30/15 0735  (1.702 m)     Head Cir --      Peak Flow --  Pain Score 05/30/15 0736 8   Constitutional: Alert and oriented. Well appearing and in no distress. Eyes: Conjunctivae are normal. PERRL. Normal extraocular movements. ENT   Head: Normocephalic and atraumatic.   Nose: No congestion/rhinnorhea.   Mouth/Throat: Mucous membranes are moist.   Neck: No stridor. Hematological/Lymphatic/Immunilogical: No cervical lymphadenopathy. Cardiovascular: Normal rate, regular rhythm.  No murmurs, rubs, or gallops. Respiratory: Normal respiratory effort without tachypnea nor retractions. Breath sounds are clear and equal bilaterally. No wheezes/rales/rhonchi. Gastrointestinal: Soft. Tender to palpation in the epigastric region. No rebound. No guarding. Rectal: Dark stool on glove. No red blood. Guaiac negative. Musculoskeletal: Normal range of motion in all extremities. No joint effusions.  No  lower extremity tenderness nor edema. Neurologic:  Normal speech and language. No gross focal neurologic deficits are appreciated. Speech is normal.  Skin:  Skin is warm, dry and intact. No rash noted. Psychiatric: Mood and affect are normal. Speech and behavior are normal. Patient exhibits appropriate insight and judgment.  ____________________________________________    LABS (pertinent positives/negatives)  Labs Reviewed  COMPREHENSIVE METABOLIC PANEL - Abnormal; Notable for the following:    Glucose, Bld 454 (*)    Alkaline Phosphatase 177 (*)    All other components within normal limits  URINALYSIS COMPLETEWITH MICROSCOPIC ([facility] ONLY) - Abnormal; Notable for the following:    Color, Urine COLORLESS (*)    APPearance CLEAR (*)    Glucose, UA >500 (*)    Ketones, ur TRACE (*)    Squamous Epithelial / LPF 0-5 (*)    All other components within normal limits  CBC  LIPASE, BLOOD     ____________________________________________   EKG  I, Phineas SemenGraydon Khyler Eschmann, attending physician, personally viewed and interpreted this EKG  EKG Time: 0745 Rate: 66 Rhythm: NSR Axis: left axis deviation Intervals: qtc 436 QRS: narrow ST changes: no st elevation, t wave inversion III, V1 Impression: abnormal EKG   ____________________________________________    RADIOLOGY  None   ____________________________________________   PROCEDURES  Procedure(s) performed: None  Critical Care performed: No  ____________________________________________   INITIAL IMPRESSION / ASSESSMENT AND PLAN / ED COURSE  Pertinent labs & imaging results that were available during my care of the patient were reviewed by me and considered in my medical decision making (see chart for details).  Patient presented to the emergency department with abdominal pain and concern for black stools. GUIAC was negative. Blood work without anemia or other concerning findings. Patient did feel better after GI cocktail.    ____________________________________________   FINAL CLINICAL IMPRESSION(S) / ED DIAGNOSES  Final diagnoses:  Gastritis     Phineas SemenGraydon Marja Adderley, MD 05/30/15 [phone]

## 2015-05-30 NOTE — ED Notes (Signed)
Pt reports black stool yesterday, this am had another episode with epigastric pain.  Skin w/d.

## 2015-11-05 ENCOUNTER — Emergency Department
Admission: EM | Admit: 2015-11-05 | Discharge: 2015-11-05 | Disposition: A | Discharge status: Home / Self Care | Payer: BLUE CROSS/BLUE SHIELD | Attending: Emergency Medicine | Admitting: Emergency Medicine

## 2015-11-05 DIAGNOSIS — Z794 Long term (current) use of insulin: Secondary | ICD-10-CM | POA: Diagnosis not present

## 2015-11-05 DIAGNOSIS — Z7984 Long term (current) use of oral hypoglycemic drugs: Secondary | ICD-10-CM | POA: Diagnosis not present

## 2015-11-05 DIAGNOSIS — E119 Type 2 diabetes mellitus without complications: Secondary | ICD-10-CM | POA: Diagnosis not present

## 2015-11-05 DIAGNOSIS — Y999 Unspecified external cause status: Secondary | ICD-10-CM | POA: Diagnosis not present

## 2015-11-05 DIAGNOSIS — W57XXXA Bitten or stung by nonvenomous insect and other nonvenomous arthropods, initial encounter: Secondary | ICD-10-CM | POA: Insufficient documentation

## 2015-11-05 DIAGNOSIS — Z87891 Personal history of nicotine dependence: Secondary | ICD-10-CM | POA: Diagnosis not present

## 2015-11-05 DIAGNOSIS — Z791 Long term (current) use of non-steroidal anti-inflammatories (NSAID): Secondary | ICD-10-CM | POA: Insufficient documentation

## 2015-11-05 DIAGNOSIS — J111 Influenza due to unidentified influenza virus with other respiratory manifestations: Secondary | ICD-10-CM

## 2015-11-05 DIAGNOSIS — Y929 Unspecified place or not applicable: Secondary | ICD-10-CM | POA: Diagnosis not present

## 2015-11-05 DIAGNOSIS — Z79899 Other long term (current) drug therapy: Secondary | ICD-10-CM | POA: Diagnosis not present

## 2015-11-05 DIAGNOSIS — S30860A Insect bite (nonvenomous) of lower back and pelvis, initial encounter: Secondary | ICD-10-CM | POA: Insufficient documentation

## 2015-11-05 DIAGNOSIS — Y939 Activity, unspecified: Secondary | ICD-10-CM | POA: Diagnosis not present

## 2015-11-05 DIAGNOSIS — E785 Hyperlipidemia, unspecified: Secondary | ICD-10-CM | POA: Diagnosis not present

## 2015-11-05 DIAGNOSIS — R52 Pain, unspecified: Secondary | ICD-10-CM | POA: Diagnosis present

## 2015-11-05 HISTORY — DX: Hyperlipidemia, unspecified: E78.5

## 2015-11-05 MED ORDER — DOXYCYCLINE HYCLATE 100 MG PO TABS
200.0000 mg | ORAL_TABLET | Freq: Once | ORAL | Status: AC
  Administered 2015-11-05: 200 mg via ORAL
  Filled 2015-11-05: qty 2

## 2015-11-05 NOTE — ED Provider Notes (Signed)
[facility] Emergency Department Provider Note  ____________________________________________  Time seen: On arrival  I have reviewed the triage vital signs and the nursing notes.   HISTORY  Chief Complaint Generalized Body Aches and Tick Removal    HPI Miguel Walters is a 50 y.o. male who presents with complaints of tick bite to the back. He also reports fatigue, mild nausea, chills over the last 2 days as well as myalgias. No rash. Tick bite discovered today. no vomiting. No recent travel. He did receive a flu shot this year    Past Medical History  Diagnosis Date  . Diabetes mellitus without complication (HCC)   . Heart murmur   . Hyperlipemia     There are no active problems to display for this patient.   Past Surgical History  Procedure Laterality Date  . Fracture surgery      Current Outpatient Rx  Name  Route  Sig  Dispense  Refill  . atorvastatin (LIPITOR) 20 MG tablet   Oral   Take 20 mg by mouth at bedtime.         . cholecalciferol (VITAMIN D) 1000 UNITS tablet   Oral   Take 1,000 Units by mouth daily.         Marland Kitchen glipiZIDE (GLUCOTROL) 10 MG tablet   Oral   Take 10 mg by mouth daily before breakfast.      0   . HUMALOG KWIKPEN 200 UNIT/ML SOPN   Subcutaneous   Inject 15 Units into the skin 3 (three) times daily before meals. And use as sliding scale as directed.      0     Dispense as written.   Marland Kitchen lisinopril (PRINIVIL,ZESTRIL) 2.5 MG tablet   Oral   Take 2.5 mg by mouth daily.         . meloxicam (MOBIC) 15 MG tablet   Oral   Take 1 tablet (15 mg total) by mouth daily.   30 tablet   2   . metFORMIN (GLUCOPHAGE-XR) 500 MG 24 hr tablet   Oral   Take 500 mg by mouth 2 (two) times daily.      0   . pantoprazole (PROTONIX) 40 MG tablet   Oral   Take 1 tablet (40 mg total) by mouth daily.   30 tablet   1   . sucralfate (CARAFATE) 1 G tablet   Oral   Take 1 tablet (1 g total) by mouth 4 (four) times  daily.   60 tablet   0   . TOUJEO SOLOSTAR 300 UNIT/ML SOPN   Subcutaneous   Inject 75 Units into the skin 2 (two) times daily.      0     Dispense as written.     Allergies Oxycodone and Hydrocodone  No family history on file.  Social History Social History  Substance Use Topics  . Smoking status: Former Smoker    Quit date: 02/23/2007  . Smokeless tobacco: None  . Alcohol Use: No    Review of Systems  Constitutional: Negative for fever. positive for chills  Eyes: Negative for visual changes. ENT: Negative for sore throat    Musculoskeletal: Negative for back pain. positive for myalgias Skin: Negative for rash. Neurological: Negative for headaches or focal weakness   ____________________________________________   PHYSICAL EXAM:  VITAL SIGNS: ED Triage Vitals  Enc Vitals Group     BP 11/05/15 1317 114/99 mmHg     Pulse Rate 11/05/15 1317 67  Resp 11/05/15 1317 16     Temp 11/05/15 1317 98.1 F (36.7 C)     Temp Source 11/05/15 1317 Oral     SpO2 11/05/15 1317 96 %     Weight 11/05/15 1317 204 lb (92.534 kg)     Height 11/05/15 1317 5\' 7"  (1.702 m)     Head Cir --      Peak Flow --      Pain Score 11/05/15 1317 8     Pain Loc --      Pain Edu? --      Excl. in GC? --      Constitutional: Alert and oriented. Well appearing and in no distress. Eyes: Conjunctivae are normal.  ENT   Head: Normocephalic and atraumatic.   Mouth/Throat: Mucous membranes are moist.. No lymphadenopathy  Cardiovascular: Normal rate, regular rhythm.  Respiratory: Normal respiratory effort without tachypnea nor retractions.  Gastrointestinal: Soft and non-tender in all quadrants. No distention. There is no CVA tenderness. Musculoskeletal: Nontender with normal range of motion in all extremities. Neurologic:  Normal speech and language. No gross focal neurologic deficits are appreciated. Skin:  Skin is warm, dry and intact. No rash noted. tick noted center of  back, no rash  Psychiatric: Mood and affect are normal. Patient exhibits appropriate insight and judgment.  ____________________________________________    LABS (pertinent positives/negatives)  Labs Reviewed - No data to display  ____________________________________________     ____________________________________________    RADIOLOGY I have personally reviewed any xrays that were ordered on this patient: None  ____________________________________________   PROCEDURES  Procedure(s) performed: tick removed with forceps   ____________________________________________   INITIAL IMPRESSION / ASSESSMENT AND PLAN / ED COURSE  Pertinent labs & imaging results that were available during my care of the patient were reviewed by me and considered in my medical decision making (see chart for details).  Patient with tick bite..removed with forceps. No evidence of lyme rash. Doxy proph given. Suspect viral illness as cause of illness which began prior to tick bite ____________________________________________   FINAL CLINICAL IMPRESSION(S) / ED DIAGNOSES  Final diagnoses:  Tick bite of back, initial encounter  Influenza     Jene Everyobert Avary Eichenberger, MD 11/05/15 1413

## 2015-11-05 NOTE — ED Notes (Signed)
Pt c/o generalized bodyaches for the past 2 days and today found a tick on his back that is still there

## 2015-11-05 NOTE — Discharge Instructions (Signed)
Tick Bite Information Ticks are insects that attach themselves to the skin and draw blood for food. There are various types of ticks. Common types include wood ticks and deer ticks. Most ticks live in shrubs and grassy areas. Ticks can climb onto your body when you make contact with leaves or grass where the tick is waiting. The most common places on the body for ticks to attach themselves are the scalp, neck, armpits, waist, and groin. Most tick bites are harmless, but sometimes ticks carry germs that cause diseases. These germs can be spread to a person during the tick's feeding process. The chance of a disease spreading through a tick bite depends on:   The type of tick.  Time of year.   How long the tick is attached.   Geographic location.  HOW CAN YOU PREVENT TICK BITES? Take these steps to help prevent tick bites when you are outdoors:  Wear protective clothing. Long sleeves and long pants are best.   Wear white clothes so you can see ticks more easily.  Tuck your pant legs into your socks.   If walking on a trail, stay in the middle of the trail to avoid brushing against bushes.  Avoid walking through areas with long grass.  Put insect repellent on all exposed skin and along boot tops, pant legs, and sleeve cuffs.   Check clothing, hair, and skin repeatedly and before going inside.   Brush off any ticks that are not attached.  Take a shower or bath as soon as possible after being outdoors.  WHAT IS THE PROPER WAY TO REMOVE A TICK? Ticks should be removed as soon as possible to help prevent diseases caused by tick bites. 1. If latex gloves are available, put them on before trying to remove a tick.  2. Using fine-point tweezers, grasp the tick as close to the skin as possible. You may also use curved forceps or a tick removal tool. Grasp the tick as close to its head as possible. Avoid grasping the tick on its body. 3. Pull gently with steady upward pressure until  the tick lets go. Do not twist the tick or jerk it suddenly. This may break off the tick's head or mouth parts. 4. Do not squeeze or crush the tick's body. This could force disease-carrying fluids from the tick into your body.  5. After the tick is removed, wash the bite area and your hands with soap and water or other disinfectant such as alcohol. 6. Apply a small amount of antiseptic cream or ointment to the bite site.  7. Wash and disinfect any instruments that were used.  Do not try to remove a tick by applying a hot match, petroleum jelly, or fingernail polish to the tick. These methods do not work and may increase the chances of disease being spread from the tick bite.  WHEN SHOULD YOU SEEK MEDICAL CARE? Contact your health care provider if you are unable to remove a tick from your skin or if a part of the tick breaks off and is stuck in the skin.  After a tick bite, you need to be aware of signs and symptoms that could be related to diseases spread by ticks. Contact your health care provider if you develop any of the following in the days or weeks after the tick bite:  Unexplained fever.  Rash. A circular rash that appears days or weeks after the tick bite may indicate the possibility of Lyme disease. The rash may resemble   a target with a bull's-eye and may occur at a different part of your body than the tick bite.  Redness and swelling in the area of the tick bite.   Tender, swollen lymph glands.   Diarrhea.   Weight loss.   Cough.   Fatigue.   Muscle, joint, or bone pain.   Abdominal pain.   Headache.   Lethargy or a change in your level of consciousness.  Difficulty walking or moving your legs.   Numbness in the legs.   Paralysis.  Shortness of breath.   Confusion.   Repeated vomiting.    This information is not intended to replace advice given to you by your health care provider. Make sure you discuss any questions you have with your health  care provider.   Document Released: 07/29/2000 Document Revised: 08/22/2014 Document Reviewed: 01/09/2013 Elsevier Interactive Patient Education 2016 [employer]  

## 2016-02-11 ENCOUNTER — Emergency Department: Payer: Self-pay

## 2016-02-11 ENCOUNTER — Encounter: Payer: Self-pay | Admitting: Emergency Medicine

## 2016-02-11 ENCOUNTER — Emergency Department
Admission: EM | Admit: 2016-02-11 | Discharge: 2016-02-11 | Disposition: A | Discharge status: Home / Self Care | Payer: Self-pay | Attending: Emergency Medicine | Admitting: Emergency Medicine

## 2016-02-11 DIAGNOSIS — R079 Chest pain, unspecified: Secondary | ICD-10-CM | POA: Insufficient documentation

## 2016-02-11 DIAGNOSIS — Z79899 Other long term (current) drug therapy: Secondary | ICD-10-CM | POA: Insufficient documentation

## 2016-02-11 DIAGNOSIS — Z7984 Long term (current) use of oral hypoglycemic drugs: Secondary | ICD-10-CM | POA: Insufficient documentation

## 2016-02-11 DIAGNOSIS — E119 Type 2 diabetes mellitus without complications: Secondary | ICD-10-CM | POA: Insufficient documentation

## 2016-02-11 DIAGNOSIS — R0602 Shortness of breath: Secondary | ICD-10-CM | POA: Insufficient documentation

## 2016-02-11 DIAGNOSIS — E785 Hyperlipidemia, unspecified: Secondary | ICD-10-CM | POA: Insufficient documentation

## 2016-02-11 DIAGNOSIS — Z87891 Personal history of nicotine dependence: Secondary | ICD-10-CM | POA: Insufficient documentation

## 2016-02-11 LAB — CBC
HEMATOCRIT: 41 % (ref 40.0–52.0)
HEMOGLOBIN: 14 g/dL (ref 13.0–18.0)
MCH: 29.6 pg (ref 26.0–34.0)
MCHC: 34.2 g/dL (ref 32.0–36.0)
MCV: 86.6 fL (ref 80.0–100.0)
Platelets: 160 10*3/uL (ref 150–440)
RBC: 4.74 MIL/uL (ref 4.40–5.90)
RDW: 13.2 % (ref 11.5–14.5)
WBC: 6.8 10*3/uL (ref 3.8–10.6)

## 2016-02-11 LAB — COMPREHENSIVE METABOLIC PANEL
ALBUMIN: 3.8 g/dL (ref 3.5–5.0)
ALK PHOS: 114 U/L (ref 38–126)
ALT: 22 U/L (ref 17–63)
AST: 20 U/L (ref 15–41)
Anion gap: 6 (ref 5–15)
BILIRUBIN TOTAL: 0.4 mg/dL (ref 0.3–1.2)
BUN: 10 mg/dL (ref 6–20)
CALCIUM: 8.6 mg/dL — AB (ref 8.9–10.3)
CO2: 25 mmol/L (ref 22–32)
CREATININE: 0.74 mg/dL (ref 0.61–1.24)
Chloride: 105 mmol/L (ref 101–111)
GFR calc non Af Amer: >60 mL/min (ref >60)
GLUCOSE: 259 mg/dL — AB (ref 65–99)
Potassium: 3.4 mmol/L — ABNORMAL LOW (ref 3.5–5.1)
SODIUM: 136 mmol/L (ref 135–145)
TOTAL PROTEIN: 6.8 g/dL (ref 6.5–8.1)

## 2016-02-11 LAB — TROPONIN I
Troponin I: <0.03 ng/mL (ref <0.03)
Troponin I: <0.03 ng/mL (ref <0.03)

## 2016-02-11 MED ORDER — AZITHROMYCIN 250 MG PO TABS: ORAL_TABLET | ORAL | Status: DC

## 2016-02-11 MED ORDER — ASPIRIN 81 MG PO CHEW
324.0000 mg | CHEWABLE_TABLET | Freq: Once | ORAL | Status: AC
  Administered 2016-02-11: 324 mg via ORAL
  Filled 2016-02-11: qty 4

## 2016-02-11 MED ORDER — NITROGLYCERIN 0.4 MG SL SUBL
0.4000 mg | SUBLINGUAL_TABLET | Freq: Once | SUBLINGUAL | Status: AC
  Administered 2016-02-11: 0.4 mg via SUBLINGUAL
  Filled 2016-02-11: qty 1

## 2016-02-11 MED ORDER — ASPIRIN 81 MG PO TBEC: 81.0000 mg | DELAYED_RELEASE_TABLET | Freq: Every day | ORAL | Status: DC

## 2016-02-11 MED ORDER — KETOROLAC TROMETHAMINE 30 MG/ML IJ SOLN
30.0000 mg | Freq: Once | INTRAMUSCULAR | Status: AC
  Administered 2016-02-11: 30 mg via INTRAVENOUS
  Filled 2016-02-11: qty 1

## 2016-02-11 NOTE — ED Notes (Addendum)
Pt presents to ED with c/o dull left sided chest pain with sob. Denies hx of the same. No increased work of breathing or acute distress noted at this time. Skin warm and dry. Pt states he noticed his pain upon waking his morning around 2am.

## 2016-02-11 NOTE — ED Provider Notes (Signed)
[facility] Emergency Department Provider Note        Time seen: ----------------------------------------- 7:02 AM on 02/11/2016 -----------------------------------------    I have reviewed the triage vital signs and the nursing notes.   HISTORY  Chief Complaint Chest Pain    HPI Miguel Walters is a 50 y.o. male who presents ER with left-sided chest pain with shortness of breath.Patient states pain started about 2 AM, describes as dull. Nothing makes it better or worse. He did have some associated shortness of breath. Patient states she's had this in the past, previously was worse and radiated into his back. He does not have the symptoms currently. Describes as 8 out of 10   Past Medical History  Diagnosis Date  . Diabetes mellitus without complication (HCC)   . Heart murmur   . Hyperlipemia     There are no active problems to display for this patient.   Past Surgical History  Procedure Laterality Date  . Fracture surgery      Allergies Oxycodone and Hydrocodone  Social History Social History  Substance Use Topics  . Smoking status: Former Smoker    Quit date: 02/23/2007  . Smokeless tobacco: Never Used  . Alcohol Use: No    Review of Systems Constitutional: Negative for fever. Cardiovascular: Positive for chest pain Respiratory: Positive shortness of breath Gastrointestinal: Negative for abdominal pain, vomiting and diarrhea. Genitourinary: Negative for dysuria. Musculoskeletal: Negative for back pain. Skin: Negative for rash. Neurological: Negative for headaches, focal weakness or numbness.  10-point ROS otherwise negative.  ____________________________________________   PHYSICAL EXAM:  VITAL SIGNS: ED Triage Vitals  Enc Vitals Group     BP 02/11/16 0657 136/87 mmHg     Pulse Rate 02/11/16 0657 54     Resp 02/11/16 0657 18     Temp 02/11/16 0657 98.2 F (36.8 C)     Temp Source 02/11/16 0657 Oral     SpO2 02/11/16  0657 98 %     Weight 02/11/16 0657 182 lb (82.555 kg)     Height 02/11/16 0657 5\' 7"  (1.702 m)     Head Cir --      Peak Flow --      Pain Score 02/11/16 0655 8     Pain Loc --      Pain Edu? --      Excl. in GC? --     Constitutional: Alert and oriented. Well appearing and in no distress. Eyes: Conjunctivae are normal. PERRL. Normal extraocular movements. ENT   Head: Normocephalic and atraumatic.   Nose: No congestion/rhinnorhea.   Mouth/Throat: Mucous membranes are moist.   Neck: No stridor. Cardiovascular: Normal rate, regular rhythm. No murmurs, rubs, or gallops. Respiratory: Normal respiratory effort without tachypnea nor retractions. Breath sounds are clear and equal bilaterally. No wheezes/rales/rhonchi. Gastrointestinal: Soft and nontender. Normal bowel sounds Musculoskeletal: Nontender with normal range of motion in all extremities. No lower extremity tenderness nor edema. Neurologic:  Normal speech and language. No gross focal neurologic deficits are appreciated.  Skin:  Skin is warm, dry and intact. No rash noted. 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 Speech and behavior are normal.  ____________________________________________  EKG: Interpreted by me. Sinus bradycardia with rate of 55 bpm, normal PR interval, normal QRS size, normal QT interval. Leftward axis, nonspecific T wave inversion  ____________________________________________  ED COURSE:  Pertinent labs & imaging results that were available during my care of the patient were reviewed by me  and considered in my medical decision making (see chart for details). Patient says the ER with chest pain started at 2 AM, we will check cardiac labs, chest x-ray. He will receive aspirin and nitroglycerin. ____________________________________________    LABS (pertinent positives/negatives)  Labs Reviewed  COMPREHENSIVE METABOLIC PANEL - Abnormal; Notable for the  following:    Potassium 3.4 (*)    Glucose, Bld 259 (*)    Calcium 8.6 (*)    All other components within normal limits  CBC  TROPONIN I  TROPONIN I    RADIOLOGY Images were viewed by me  Chest x-ray IMPRESSION: Early right middle lobe infiltrate. ____________________________________________  FINAL ASSESSMENT AND PLAN  Chest pain  Plan: Patient with labs and imaging as dictated above. Patient does not have symptoms of pneumonia other than some transient shortness of breath. Troponin is negative 2 here. I will prescribe a Z-Pak, advised close follow-up with cardiology for reevaluation. Heart score is low risk.   Emily FilbertWilliams, Jonathan E, MD   Note: This dictation was prepared with Dragon dictation. Any transcriptional errors that result from this process are unintentional   Emily FilbertJonathan E Williams, MD 02/11/16 1004

## 2016-02-11 NOTE — Discharge Instructions (Signed)
Nonspecific Chest Pain  °Chest pain can be caused by many different conditions. There is always a chance that your pain could be related to something serious, such as a heart attack or a blood clot in your lungs. Chest pain can also be caused by conditions that are not life-threatening. If you have chest pain, it is very important to follow up with your health care provider. °CAUSES  °Chest pain can be caused by: °· Heartburn. °· Pneumonia or bronchitis. °· Anxiety or stress. °· Inflammation around your heart (pericarditis) or lung (pleuritis or pleurisy). °· A blood clot in your lung. °· A collapsed lung (pneumothorax). It can develop suddenly on its own (spontaneous pneumothorax) or from trauma to the chest. °· Shingles infection (varicella-zoster virus). °· Heart attack. °· Damage to the bones, muscles, and cartilage that make up your chest wall. This can include: °¨ Bruised bones due to injury. °¨ Strained muscles or cartilage due to frequent or repeated coughing or overwork. °¨ Fracture to one or more ribs. °¨ Sore cartilage due to inflammation (costochondritis). °RISK FACTORS  °Risk factors for chest pain may include: °· Activities that increase your risk for trauma or injury to your chest. °· Respiratory infections or conditions that cause frequent coughing. °· Medical conditions or overeating that can cause heartburn. °· Heart disease or family history of heart disease. °· Conditions or health behaviors that increase your risk of developing a blood clot. °· Having had chicken pox (varicella zoster). °SIGNS AND SYMPTOMS °Chest pain can feel like: °· Burning or tingling on the surface of your chest or deep in your chest. °· Crushing, pressure, aching, or squeezing pain. °· Dull or sharp pain that is worse when you move, cough, or take a deep breath. °· Pain that is also felt in your back, neck, shoulder, or arm, or pain that spreads to any of these areas. °Your chest pain may come and go, or it may stay  constant. °DIAGNOSIS °Lab tests or other studies may be needed to find the cause of your pain. Your health care provider may have you take a test called an ambulatory ECG (electrocardiogram). An ECG records your heartbeat patterns at the time the test is performed. You may also have other tests, such as: °· Transthoracic echocardiogram (TTE). During echocardiography, sound waves are used to create a picture of all of the heart structures and to look at how blood flows through your heart. °· Transesophageal echocardiogram (TEE). This is a more advanced imaging test that obtains images from inside your body. It allows your health care provider to see your heart in finer detail. °· Cardiac monitoring. This allows your health care provider to monitor your heart rate and rhythm in real time. °· Holter monitor. This is a portable device that records your heartbeat and can help to diagnose abnormal heartbeats. It allows your health care provider to track your heart activity for several days, if needed. °· Stress tests. These can be done through exercise or by taking medicine that makes your heart beat more quickly. °· Blood tests. °· Imaging tests. °TREATMENT  °Your treatment depends on what is causing your chest pain. Treatment may include: °· Medicines. These may include: °¨ Acid blockers for heartburn. °¨ Anti-inflammatory medicine. °¨ Pain medicine for inflammatory conditions. °¨ Antibiotic medicine, if an infection is present. °¨ Medicines to dissolve blood clots. °¨ Medicines to treat coronary artery disease. °· Supportive care for conditions that do not require medicines. This may include: °¨ Resting. °¨ Applying heat   or cold packs to injured areas. °¨ Limiting activities until pain decreases. °HOME CARE INSTRUCTIONS °· If you were prescribed an antibiotic medicine, finish it all even if you start to feel better. °· Avoid any activities that bring on chest pain. °· Do not use any tobacco products, including  cigarettes, chewing tobacco, or electronic cigarettes. If you need help quitting, ask your health care provider. °· Do not drink alcohol. °· Take medicines only as directed by your health care provider. °· Keep all follow-up visits as directed by your health care provider. This is important. This includes any further testing if your chest pain does not go away. °· If heartburn is the cause for your chest pain, you may be told to keep your head raised (elevated) while sleeping. This reduces the chance that acid will go from your stomach into your esophagus. °· Make lifestyle changes as directed by your health care provider. These may include: °¨ Getting regular exercise. Ask your health care provider to suggest some activities that are safe for you. °¨ Eating a heart-healthy diet. A registered dietitian can help you to learn healthy eating options. °¨ Maintaining a healthy weight. °¨ Managing diabetes, if necessary. °¨ Reducing stress. °SEEK MEDICAL CARE IF: °· Your chest pain does not go away after treatment. °· You have a rash with blisters on your chest. °· You have a fever. °SEEK IMMEDIATE MEDICAL CARE IF:  °· Your chest pain is worse. °· You have an increasing cough, or you cough up blood. °· You have severe abdominal pain. °· You have severe weakness. °· You faint. °· You have chills. °· You have sudden, unexplained chest discomfort. °· You have sudden, unexplained discomfort in your arms, back, neck, or jaw. °· You have shortness of breath at any time. °· You suddenly start to sweat, or your skin gets clammy. °· You feel nauseous or you vomit. °· You suddenly feel light-headed or dizzy. °· Your heart begins to beat quickly, or it feels like it is skipping beats. °These symptoms may represent a serious problem that is an emergency. Do not wait to see if the symptoms will go away. Get medical help right away. Call your local emergency services (911 in the [place] Do not drive yourself to the hospital. °  °This  information is not intended to replace advice given to you by your health care provider. Make sure you discuss any questions you have with your health care provider. °  °Document Released: 05/11/2005 Document Revised: 08/22/2014 Document Reviewed: 03/07/2014 °Elsevier Interactive Patient Education ©2016 [employer] ° °

## 2016-02-14 ENCOUNTER — Emergency Department: Payer: Self-pay

## 2016-02-14 ENCOUNTER — Emergency Department
Admission: EM | Admit: 2016-02-14 | Discharge: 2016-02-14 | Disposition: A | Discharge status: Home / Self Care | Payer: Self-pay | Attending: Emergency Medicine | Admitting: Emergency Medicine

## 2016-02-14 ENCOUNTER — Encounter: Payer: Self-pay | Admitting: Emergency Medicine

## 2016-02-14 DIAGNOSIS — Z7982 Long term (current) use of aspirin: Secondary | ICD-10-CM | POA: Insufficient documentation

## 2016-02-14 DIAGNOSIS — E785 Hyperlipidemia, unspecified: Secondary | ICD-10-CM | POA: Insufficient documentation

## 2016-02-14 DIAGNOSIS — Z7984 Long term (current) use of oral hypoglycemic drugs: Secondary | ICD-10-CM | POA: Insufficient documentation

## 2016-02-14 DIAGNOSIS — E119 Type 2 diabetes mellitus without complications: Secondary | ICD-10-CM | POA: Insufficient documentation

## 2016-02-14 DIAGNOSIS — J189 Pneumonia, unspecified organism: Secondary | ICD-10-CM | POA: Insufficient documentation

## 2016-02-14 DIAGNOSIS — Z87891 Personal history of nicotine dependence: Secondary | ICD-10-CM | POA: Insufficient documentation

## 2016-02-14 LAB — GLUCOSE, CAPILLARY: GLUCOSE-CAPILLARY: 266 mg/dL — AB (ref 65–99)

## 2016-02-14 LAB — BASIC METABOLIC PANEL
Anion gap: 9 (ref 5–15)
BUN: 8 mg/dL (ref 6–20)
CHLORIDE: 99 mmol/L — AB (ref 101–111)
CO2: 25 mmol/L (ref 22–32)
CREATININE: 0.97 mg/dL (ref 0.61–1.24)
Calcium: 9 mg/dL (ref 8.9–10.3)
GFR calc non Af Amer: >60 mL/min (ref >60)
GLUCOSE: 407 mg/dL — AB (ref 65–99)
Potassium: 3.3 mmol/L — ABNORMAL LOW (ref 3.5–5.1)
Sodium: 133 mmol/L — ABNORMAL LOW (ref 135–145)

## 2016-02-14 LAB — CBC
HCT: 40.4 % (ref 40.0–52.0)
Hemoglobin: 14.1 g/dL (ref 13.0–18.0)
MCH: 30.2 pg (ref 26.0–34.0)
MCHC: 34.9 g/dL (ref 32.0–36.0)
MCV: 86.5 fL (ref 80.0–100.0)
PLATELETS: 171 10*3/uL (ref 150–440)
RBC: 4.67 MIL/uL (ref 4.40–5.90)
RDW: 13.8 % (ref 11.5–14.5)
WBC: 5.6 10*3/uL (ref 3.8–10.6)

## 2016-02-14 LAB — TROPONIN I

## 2016-02-14 MED ORDER — SODIUM CHLORIDE 0.9 % IV SOLN
1000.0000 mL | Freq: Once | INTRAVENOUS | Status: AC
  Administered 2016-02-14: 1000 mL via INTRAVENOUS

## 2016-02-14 MED ORDER — AZITHROMYCIN 500 MG IV SOLR
INTRAVENOUS | Status: DC
Start: 2016-02-14 — End: 2016-02-15
  Filled 2016-02-14: qty 500

## 2016-02-14 MED ORDER — DEXTROSE 5 % IV SOLN
500.0000 mg | Freq: Once | INTRAVENOUS | Status: AC
  Administered 2016-02-14: 500 mg via INTRAVENOUS
  Filled 2016-02-14: qty 500

## 2016-02-14 NOTE — ED Notes (Signed)
Patient states that he is continuing to have chest pain and shortness of breath. Patient seen in the ED Thursday for same complaint but per patient he has gotten worse. Patient states that he cannot do anything without getting short of breath.

## 2016-02-14 NOTE — ED Notes (Signed)
Patient transported to X-ray 

## 2016-02-14 NOTE — Discharge Instructions (Signed)

## 2016-02-14 NOTE — ED Provider Notes (Signed)
[facility] Emergency Department Provider Note  ____________________________________________    I have reviewed the triage vital signs and the nursing notes.   HISTORY  Chief Complaint Chest Pain and Shortness of Breath    HPI Miguel Walters is a 50 y.o. male who presents with chest discomfort, shortness of breath and mild cough. Patient was seen in the emergency department 3 days ago for similar symptoms and diagnosed with pneumonia. He reports he only got his antibiotics prescription filled today and has not taken any yet. He reports he continues to feel short of breath with any exertion. He denies diaphoresis. Denies fever.     Past Medical History  Diagnosis Date  . Diabetes mellitus without complication (HCC)   . Heart murmur   . Hyperlipemia     There are no active problems to display for this patient.   Past Surgical History  Procedure Laterality Date  . Fracture surgery      Current Outpatient Rx  Name  Route  Sig  Dispense  Refill  . aspirin 81 MG EC tablet   Oral   Take 1 tablet (81 mg total) by mouth daily. Swallow whole.   30 tablet   12   . azithromycin (ZITHROMAX Z-PAK) 250 MG tablet      Take 2 tablets (500 mg) on  Day 1,  followed by 1 tablet (250 mg) once daily on Days 2 through 5.   6 each   0   . cholecalciferol (VITAMIN D) 1000 UNITS tablet   Oral   Take 1,000 Units by mouth daily.         . metFORMIN (GLUCOPHAGE-XR) 500 MG 24 hr tablet   Oral   Take 1,000 mg by mouth 2 (two) times daily.       0     Allergies Oxycodone and Hydrocodone  No family history on file.  Social History Social History  Substance Use Topics  . Smoking status: Former Smoker    Quit date: 02/23/2007  . Smokeless tobacco: Never Used  . Alcohol Use: No    Review of Systems  Constitutional: Negative for fever. Eyes: Negative for redness ENT: Negative for Neck pain Cardiovascular: As above Respiratory: As  above Gastrointestinal: Negative for abdominal pain Genitourinary: Negative for dysuria. Musculoskeletal: Negative for back pain. Skin: Negative for rash. Neurological: Negative for headache Psychiatric: no anxiety    ____________________________________________   PHYSICAL EXAM:  VITAL SIGNS: ED Triage Vitals  Enc Vitals Group     BP 02/14/16 1845 128/65 mmHg     Pulse Rate 02/14/16 1845 62     Resp 02/14/16 1845 18     Temp 02/14/16 1845 98.3 F (36.8 C)     Temp Source 02/14/16 1845 Oral     SpO2 02/14/16 1845 97 %     Weight 02/14/16 1845 180 lb (81.647 kg)     Height 02/14/16 1845 5\' 7"  (1.702 m)     Head Cir --      Peak Flow --      Pain Score 02/14/16 1852 8     Pain Loc --      Pain Edu? --      Excl. in GC? --      Constitutional: Alert and oriented. Well appearing and in no distress.  Eyes: Conjunctivae are normal. No erythema or injection ENT   Head: Normocephalic and atraumatic.   Mouth/Throat: Mucous membranes are moist. Cardiovascular: Normal rate, regular rhythm. Normal and symmetric distal  pulses are present in the upper extremities.Keloid scar noted Respiratory: Normal respiratory effort without tachypnea nor retractions. Breath sounds are clear and equal bilaterally.  Gastrointestinal: Soft and non-tender in all quadrants. No distention. There is no CVA tenderness. Genitourinary: deferred Musculoskeletal: Nontender with normal range of motion in all extremities. No lower extremity tenderness nor edema. Neurologic:  Normal speech and language. No gross focal neurologic deficits are appreciated. Skin:  Skin is warm, dry and intact. No rash noted. Psychiatric: Mood and affect are normal. Patient exhibits appropriate insight and judgment.  ____________________________________________    LABS (pertinent positives/negatives)  Labs Reviewed  BASIC METABOLIC PANEL - Abnormal; Notable for the following:    Sodium 133 (*)    Potassium 3.3 (*)     Chloride 99 (*)    Glucose, Bld 407 (*)    All other components within normal limits  CBC  TROPONIN I    ____________________________________________   EKG  ED ECG REPORT I, Jene EveryKINNER, [place], the attending physician, personally viewed and interpreted this ECG.  Date: 02/14/2016 EKG Time: 6:45 PM Rate: 78 Rhythm: normal sinus rhythm QRS Axis: Left Axis deviation Intervals: normal ST/T Wave abnormalities: normal Conduction Disturbances: Occasional PVC    ____________________________________________    RADIOLOGY  Chest x-ray again shows pneumonia  ____________________________________________   PROCEDURES  Procedure(s) performed: none  Critical Care performed: none  ____________________________________________   INITIAL IMPRESSION / ASSESSMENT AND PLAN / ED COURSE  Pertinent labs & imaging results that were available during my care of the patient were reviewed by me and considered in my medical decision making (see chart for details).  Patient presents with continued shortness of breath and chest discomfort, this is probably because he has not started his antibiotics yet. He is overall well-appearing and in no distress. 98% on room air. Lab work is reassuring except for elevated glucose. We will give IV fluids, a dose of IV antibiotics in the ED and I suspect he will be appropriate for discharge with outpatient follow-up.  ----------------------------------------- 9:16 PM on 02/14/2016 -----------------------------------------  Patient's glucose has improved. He has received IV antibiotics. He is well-appearing, counseled him to take his antibiotics but to return to the emergency Department if any worsening symptoms.  ____________________________________________   FINAL CLINICAL IMPRESSION(S) / ED DIAGNOSES  Final diagnoses:  Community acquired pneumonia          Jene Everyobert Meeya Goldin, MD 02/14/16 2238

## 2016-04-10 ENCOUNTER — Emergency Department: Payer: PRIVATE HEALTH INSURANCE

## 2016-04-10 ENCOUNTER — Emergency Department
Admission: EM | Admit: 2016-04-10 | Discharge: 2016-04-10 | Disposition: A | Discharge status: Home / Self Care | Payer: PRIVATE HEALTH INSURANCE | Attending: Emergency Medicine | Admitting: Emergency Medicine

## 2016-04-10 ENCOUNTER — Encounter: Payer: Self-pay | Admitting: Emergency Medicine

## 2016-04-10 DIAGNOSIS — Z87891 Personal history of nicotine dependence: Secondary | ICD-10-CM | POA: Insufficient documentation

## 2016-04-10 DIAGNOSIS — Y999 Unspecified external cause status: Secondary | ICD-10-CM | POA: Insufficient documentation

## 2016-04-10 DIAGNOSIS — Y939 Activity, unspecified: Secondary | ICD-10-CM | POA: Diagnosis not present

## 2016-04-10 DIAGNOSIS — S70262A Insect bite (nonvenomous), left hip, initial encounter: Secondary | ICD-10-CM | POA: Insufficient documentation

## 2016-04-10 DIAGNOSIS — Y929 Unspecified place or not applicable: Secondary | ICD-10-CM | POA: Diagnosis not present

## 2016-04-10 DIAGNOSIS — Z7984 Long term (current) use of oral hypoglycemic drugs: Secondary | ICD-10-CM | POA: Diagnosis not present

## 2016-04-10 DIAGNOSIS — Z7982 Long term (current) use of aspirin: Secondary | ICD-10-CM | POA: Diagnosis not present

## 2016-04-10 DIAGNOSIS — W57XXXA Bitten or stung by nonvenomous insect and other nonvenomous arthropods, initial encounter: Secondary | ICD-10-CM | POA: Insufficient documentation

## 2016-04-10 DIAGNOSIS — R21 Rash and other nonspecific skin eruption: Secondary | ICD-10-CM

## 2016-04-10 DIAGNOSIS — R1012 Left upper quadrant pain: Secondary | ICD-10-CM | POA: Insufficient documentation

## 2016-04-10 DIAGNOSIS — Z79899 Other long term (current) drug therapy: Secondary | ICD-10-CM | POA: Diagnosis not present

## 2016-04-10 DIAGNOSIS — R739 Hyperglycemia, unspecified: Secondary | ICD-10-CM

## 2016-04-10 DIAGNOSIS — R197 Diarrhea, unspecified: Secondary | ICD-10-CM | POA: Diagnosis not present

## 2016-04-10 DIAGNOSIS — E1165 Type 2 diabetes mellitus with hyperglycemia: Secondary | ICD-10-CM | POA: Diagnosis not present

## 2016-04-10 DIAGNOSIS — R109 Unspecified abdominal pain: Secondary | ICD-10-CM

## 2016-04-10 LAB — COMPREHENSIVE METABOLIC PANEL
ALBUMIN: 3.7 g/dL (ref 3.5–5.0)
ALK PHOS: 125 U/L (ref 38–126)
ALT: 17 U/L (ref 17–63)
ANION GAP: 6 (ref 5–15)
AST: 22 U/L (ref 15–41)
BILIRUBIN TOTAL: 0.4 mg/dL (ref 0.3–1.2)
BUN: 13 mg/dL (ref 6–20)
CALCIUM: 8.5 mg/dL — AB (ref 8.9–10.3)
CO2: 21 mmol/L — AB (ref 22–32)
Chloride: 108 mmol/L (ref 101–111)
Creatinine, Ser: 0.66 mg/dL (ref 0.61–1.24)
GFR calc Af Amer: >60 mL/min (ref >60)
GFR calc non Af Amer: >60 mL/min (ref >60)
GLUCOSE: 313 mg/dL — AB (ref 65–99)
Potassium: 3.8 mmol/L (ref 3.5–5.1)
Sodium: 135 mmol/L (ref 135–145)
Total Protein: 6.8 g/dL (ref 6.5–8.1)

## 2016-04-10 LAB — URINALYSIS COMPLETE WITH MICROSCOPIC (ARMC ONLY)
BILIRUBIN URINE: NEGATIVE
Hgb urine dipstick: NEGATIVE
Leukocytes, UA: NEGATIVE
Nitrite: NEGATIVE
Protein, ur: NEGATIVE mg/dL
Specific Gravity, Urine: 1.027 (ref 1.005–1.030)
pH: 6 (ref 5.0–8.0)

## 2016-04-10 LAB — CBC
HCT: 43.2 % (ref 40.0–52.0)
HEMOGLOBIN: 15 g/dL (ref 13.0–18.0)
MCH: 29.8 pg (ref 26.0–34.0)
MCHC: 34.8 g/dL (ref 32.0–36.0)
MCV: 85.9 fL (ref 80.0–100.0)
Platelets: 148 10*3/uL — ABNORMAL LOW (ref 150–440)
RBC: 5.03 MIL/uL (ref 4.40–5.90)
RDW: 13 % (ref 11.5–14.5)
WBC: 6.3 10*3/uL (ref 3.8–10.6)

## 2016-04-10 LAB — LIPASE, BLOOD: LIPASE: 23 U/L (ref 11–51)

## 2016-04-10 MED ORDER — ONDANSETRON HCL 4 MG/2ML IJ SOLN
4.0000 mg | Freq: Once | INTRAMUSCULAR | Status: AC
  Administered 2016-04-10: 4 mg via INTRAVENOUS

## 2016-04-10 MED ORDER — INSULIN ASPART 100 UNIT/ML ~~LOC~~ SOLN
10.0000 [IU] | Freq: Once | SUBCUTANEOUS | Status: AC
  Administered 2016-04-10: 10 [IU] via SUBCUTANEOUS
  Filled 2016-04-10: qty 0.1
  Filled 2016-04-10: qty 10

## 2016-04-10 MED ORDER — SODIUM CHLORIDE 0.9 % IV BOLUS (SEPSIS)
1000.0000 mL | Freq: Once | INTRAVENOUS | Status: AC
  Administered 2016-04-10: 1000 mL via INTRAVENOUS

## 2016-04-10 MED ORDER — FENTANYL CITRATE (PF) 100 MCG/2ML IJ SOLN
INTRAMUSCULAR | Status: DC
Start: 2016-04-10 — End: 2016-04-10
  Filled 2016-04-10: qty 2

## 2016-04-10 MED ORDER — IOPAMIDOL (ISOVUE-300) INJECTION 61%
100.0000 mL | Freq: Once | INTRAVENOUS | Status: AC | PRN
  Administered 2016-04-10: 100 mL via INTRAVENOUS
  Filled 2016-04-10: qty 100

## 2016-04-10 MED ORDER — LOPERAMIDE HCL 2 MG PO TABS: 2.0000 mg | ORAL_TABLET | Freq: Four times a day (QID) | ORAL | 0 refills | Status: DC | PRN

## 2016-04-10 MED ORDER — DOXYCYCLINE HYCLATE 100 MG PO TABS
ORAL_TABLET | ORAL | Status: AC
  Filled 2016-04-10: qty 1

## 2016-04-10 MED ORDER — DOXYCYCLINE HYCLATE 100 MG PO TABS
100.0000 mg | ORAL_TABLET | Freq: Once | ORAL | Status: AC
  Administered 2016-04-10: 100 mg via ORAL
  Filled 2016-04-10: qty 1

## 2016-04-10 MED ORDER — ONDANSETRON HCL 4 MG/2ML IJ SOLN
INTRAMUSCULAR | Status: AC
  Filled 2016-04-10: qty 2

## 2016-04-10 MED ORDER — ONDANSETRON 4 MG PO TBDP: 4.0000 mg | ORAL_TABLET | Freq: Three times a day (TID) | ORAL | 0 refills | Status: DC | PRN

## 2016-04-10 MED ORDER — FENTANYL CITRATE (PF) 100 MCG/2ML IJ SOLN
50.0000 ug | Freq: Once | INTRAMUSCULAR | Status: AC
  Administered 2016-04-10: 50 ug via INTRAVENOUS

## 2016-04-10 MED ORDER — DIATRIZOATE MEGLUMINE & SODIUM 66-10 % PO SOLN
15.0000 mL | Freq: Once | ORAL | Status: AC
  Administered 2016-04-10: 15 mL via ORAL

## 2016-04-10 MED ORDER — DOXYCYCLINE HYCLATE 50 MG PO CAPS: 100.0000 mg | ORAL_CAPSULE | Freq: Two times a day (BID) | ORAL | 0 refills | Status: AC

## 2016-04-10 NOTE — ED Triage Notes (Signed)
Pt c/o nausea and diarrhea that started last night. Denies vomiting. C/o lower mid abdominal pain.

## 2016-04-10 NOTE — ED Provider Notes (Signed)
[facility] Emergency Department Provider Note  ____________________________________________  Time seen: Approximately 3:53 PM  I have reviewed the triage vital signs and the nursing notes.   HISTORY  Chief Complaint Diarrhea    HPI Miguel Walters is a 50 y.o. male w/ a hx of DM2, HL presenting w/ L sided abd pain and diarrhea. The patient reports that at 5 AM today he awoke with severe left abdominal pain. Since then he has had 2 episodes of watery, nonbloody loose stool. His had nausea without associated vomiting no fever. No penile or scrotal pain.The patient also reports that his wife removed a tick from his left hip on Wednesday and that he has noted a small amount of erythema around it. He has not had a headache or any fever.   Past Medical History:  Diagnosis Date  . Diabetes mellitus without complication (HCC)   . Heart murmur   . Hyperlipemia     There are no active problems to display for this patient.   Past Surgical History:  Procedure Laterality Date  . FRACTURE SURGERY      Current Outpatient Rx  . Order #: [id number] Class: Print  . Order #: [id number] Class: Print  . Order #: [id number] Class: Historical Med  . Order #: [id number] Class: Print  . Order #: [id number] Class: Historical Med  . Order #: [id number] Class: Print    Allergies Oxycodone and Hydrocodone  History reviewed. No pertinent family history.  Social History Social History  Substance Use Topics  . Smoking status: Former Smoker    Quit date: 02/23/2007  . Smokeless tobacco: Never Used  . Alcohol use No    Review of Systems Constitutional: No fever/chills.No lightheadedness or syncope. Eyes: No visual changes. ENT: No sore throat. No congestion or rhinorrhea. Cardiovascular: Denies chest pain. Denies palpitations. Respiratory: Denies shortness of breath.  No cough. Gastrointestinal: Positive left-sided abdominal pain.  Positive nausea, no vomiting.  Positive  diarrhea.  No constipation. Genitourinary: Negative for dysuria. No penile or scrotal pain. Musculoskeletal: Negative for back pain. Skin: Positive for rash after tick bite. Neurological: Negative for headaches. No focal numbness, tingling or weakness.   10-point ROS otherwise negative.  ____________________________________________   PHYSICAL EXAM:  VITAL SIGNS: ED Triage Vitals  Enc Vitals Group     BP 04/10/16 1348 113/74     Pulse Rate 04/10/16 1348 69     Resp 04/10/16 1348 18     Temp 04/10/16 1348 98.2 F (36.8 C)     Temp Source 04/10/16 1348 Oral     SpO2 04/10/16 1348 98 %     Weight 04/10/16 1346 182 lb (82.6 kg)     Height 04/10/16 1346 5\' 7"  (1.702 m)     Head Circumference --      Peak Flow --      Pain Score 04/10/16 1346 7     Pain Loc --      Pain Edu? --      Excl. in GC? --     Constitutional: Alert and oriented. Well appearing and in no acute distress. Answers questions appropriately. Eyes: Conjunctivae are normal.  EOMI. No scleral icterus. Head: Atraumatic. Nose: No congestion/rhinnorhea. Mouth/Throat: Mucous membranes are moist.  Neck: No stridor.  Supple.  No JVD. No meningismus. Cardiovascular: Normal rate, regular rhythm. No murmurs, rubs or gallops.  Respiratory: Normal respiratory effort.  No accessory muscle use or retractions. Lungs CTAB.  No wheezes, rales or ronchi. Gastrointestinal: Soft and nondistended. The patient does have tenderness  to palpation in the left side with the left upper quadrant greater than the left lower quadrant. Negative Murphy sign.  No guarding or rebound.  No peritoneal signs. Musculoskeletal: No LE edema. No ttp in the calves or palpable cords.  Negative Homan's sign. Neurologic:  A&Ox3.  Speech is clear.  Face and smile are symmetric.  EOMI.  Moves all extremities well. Skin:  Skin is warm, dry and intact. On the left hip, the patient has an area of erythema that is 1.5 x 1.5 cm, not raised, not warm, without  fluctuance at the area where he says that he had his tick bite.Marland Kitchen Psychiatric: Mood and affect are normal. Speech and behavior are normal.  Normal judgement.  ____________________________________________   LABS (all labs ordered are listed, but only abnormal results are displayed)  Labs Reviewed  COMPREHENSIVE METABOLIC PANEL - Abnormal; Notable for the following:       Result Value   CO2 21 (*)    Glucose, Bld 313 (*)    Calcium 8.5 (*)    All other components within normal limits  CBC - Abnormal; Notable for the following:    Platelets 148 (*)    All other components within normal limits  URINALYSIS COMPLETEWITH MICROSCOPIC ([facility] ONLY) - Abnormal; Notable for the following:    Color, Urine STRAW (*)    APPearance CLEAR (*)    Glucose, UA >500 (*)    Ketones, ur 1+ (*)    Bacteria, UA RARE (*)    Squamous Epithelial / LPF 0-5 (*)    All other components within normal limits  LIPASE, BLOOD   ____________________________________________  EKG  Not indicated ____________________________________________  RADIOLOGY  Ct Abdomen Pelvis W Contrast  Result Date: 04/10/2016 CLINICAL DATA:  Abdominal pain, nausea. EXAM: CT ABDOMEN AND PELVIS WITH CONTRAST TECHNIQUE: Multidetector CT imaging of the abdomen and pelvis was performed using the standard protocol following bolus administration of intravenous contrast. CONTRAST:  ISOVUE-300 IOPAMIDOL (ISOVUE-300) INJECTION 61% COMPARISON:  None. FINDINGS: Lower chest:  No acute findings. Hepatobiliary: No masses or other significant abnormality. Pancreas: No mass, inflammatory changes, or other significant abnormality. Spleen: Within normal limits in size and appearance. Adrenals/Urinary Tract: No masses identified. No evidence of hydronephrosis. Stomach/Bowel: No evidence of obstruction, inflammatory process, or abnormal fluid collections. No pneumatosis, pneumoperitoneum or portal venous gas. Normal appendix. Vascular/Lymphatic: No  pathologically enlarged lymph nodes. No evidence of abdominal aortic aneurysm. Reproductive: No mass or other significant abnormality. Other: None. Musculoskeletal: No suspicious bone lesions identified. Mild osteoarthritis of bilateral SI joints. IMPRESSION: No acute abdominal or pelvic pathology. Electronically Signed   By: Elige Ko   On: 04/10/2016 17:10    ____________________________________________   PROCEDURES  Procedure(s) performed: None  Procedures  Critical Care performed: No ____________________________________________   INITIAL IMPRESSION / ASSESSMENT AND PLAN / ED COURSE  Pertinent labs & imaging results that were available during my care of the patient were reviewed by me and considered in my medical decision making (see chart for details).  50 y.o. male with a history of diabetes presenting with left-sided abdominal pain with diarrhea, as well as tick bite with resulting skin erythema. I will plan to evaluate the patient for diverticulitis with a CT scan. We will treat his symptoms with an antiemetic, and fluids. He does have hyperglycemia without DKA, and states he is on his oral anti-hyperglycemics but has not been taking the insulin that he is supposed to be on because of health insurance issues. The patient's  diarrhea could also be from a viral or foodborne illness. For the patient's rash that is related to his tick bite, we will initiate doxycycline and prescribe this for the patient home.   ----------------------------------------- 5:39 PM on 04/10/2016 -----------------------------------------  The patient has done well in the emergency department and is feeling better. He is able to tolerate liquid. His CT scan does not show any acute intra-abdominal pathology. I will discharge him home with treatment for diarrhea and nausea. I will also send him home with antibiotics for his tick bite and resulting rash. He will follow up with her primary care physician for  reevaluation. Return precautions as well as follow-up instructions were discussed.   ____________________________________________  FINAL CLINICAL IMPRESSION(S) / ED DIAGNOSES  Final diagnoses:  Tick bite  Rash  Hyperglycemia  Diarrhea, unspecified type  Left sided abdominal pain    Clinical Course      NEW MEDICATIONS STARTED DURING THIS VISIT:  New Prescriptions   LOPERAMIDE (IMODIUM A-D) 2 MG TABLET    Take 1 tablet (2 mg total) by mouth 4 (four) times daily as needed for diarrhea or loose stools.   ONDANSETRON (ZOFRAN ODT) 4 MG DISINTEGRATING TABLET    Take 1 tablet (4 mg total) by mouth every 8 (eight) hours as needed for nausea or vomiting.      Rockne MenghiniAnne-Caroline Tyria Springer, MD 04/10/16 1740

## 2016-04-10 NOTE — Discharge Instructions (Signed)
Please take the entire course of doxycycline, which is an antibiotic to prevent tickborne illness.  Please make an appointment with a primary care physician to discuss your diabetes management.  Please use loperamide for diarrhea, and take a BRAT diet for the next 48 hours to prevent diarrhea.  Return to the emergency department if you develop lightheadedness or fainting, abdominal pain, fever, inability to keep down fluids, or any other symptoms concerning to you.

## 2016-04-16 ENCOUNTER — Emergency Department
Admission: EM | Admit: 2016-04-16 | Discharge: 2016-04-17 | Disposition: A | Discharge status: Home / Self Care | Payer: PRIVATE HEALTH INSURANCE | Attending: Emergency Medicine | Admitting: Emergency Medicine

## 2016-04-16 ENCOUNTER — Encounter: Payer: Self-pay | Admitting: Emergency Medicine

## 2016-04-16 DIAGNOSIS — Z87891 Personal history of nicotine dependence: Secondary | ICD-10-CM | POA: Insufficient documentation

## 2016-04-16 DIAGNOSIS — R220 Localized swelling, mass and lump, head: Secondary | ICD-10-CM | POA: Diagnosis not present

## 2016-04-16 DIAGNOSIS — Z7982 Long term (current) use of aspirin: Secondary | ICD-10-CM | POA: Insufficient documentation

## 2016-04-16 DIAGNOSIS — E119 Type 2 diabetes mellitus without complications: Secondary | ICD-10-CM | POA: Diagnosis not present

## 2016-04-16 DIAGNOSIS — R221 Localized swelling, mass and lump, neck: Secondary | ICD-10-CM | POA: Insufficient documentation

## 2016-04-16 DIAGNOSIS — Z792 Long term (current) use of antibiotics: Secondary | ICD-10-CM | POA: Insufficient documentation

## 2016-04-16 DIAGNOSIS — Z7984 Long term (current) use of oral hypoglycemic drugs: Secondary | ICD-10-CM | POA: Diagnosis not present

## 2016-04-16 DIAGNOSIS — Z79899 Other long term (current) drug therapy: Secondary | ICD-10-CM | POA: Diagnosis not present

## 2016-04-16 LAB — CBC WITH DIFFERENTIAL/PLATELET
BASOS ABS: 0 10*3/uL (ref 0–0.1)
Basophils Relative: 1 %
EOS ABS: 0.1 10*3/uL (ref 0–0.7)
EOS PCT: 1 %
HCT: 42.3 % (ref 40.0–52.0)
Hemoglobin: 14.6 g/dL (ref 13.0–18.0)
Lymphocytes Relative: 34 %
Lymphs Abs: 1.9 10*3/uL (ref 1.0–3.6)
MCH: 29.5 pg (ref 26.0–34.0)
MCHC: 34.5 g/dL (ref 32.0–36.0)
MCV: 85.6 fL (ref 80.0–100.0)
MONO ABS: 0.4 10*3/uL (ref 0.2–1.0)
Monocytes Relative: 7 %
Neutro Abs: 3.3 10*3/uL (ref 1.4–6.5)
Neutrophils Relative %: 57 %
PLATELETS: 159 10*3/uL (ref 150–440)
RBC: 4.94 MIL/uL (ref 4.40–5.90)
RDW: 13.4 % (ref 11.5–14.5)
WBC: 5.7 10*3/uL (ref 3.8–10.6)

## 2016-04-16 LAB — COMPREHENSIVE METABOLIC PANEL
ALBUMIN: 4.1 g/dL (ref 3.5–5.0)
ALK PHOS: 163 U/L — AB (ref 38–126)
ALT: 20 U/L (ref 17–63)
AST: 20 U/L (ref 15–41)
Anion gap: 5 (ref 5–15)
BILIRUBIN TOTAL: 0.3 mg/dL (ref 0.3–1.2)
BUN: 14 mg/dL (ref 6–20)
CO2: 24 mmol/L (ref 22–32)
CREATININE: 0.78 mg/dL (ref 0.61–1.24)
Calcium: 8.8 mg/dL — ABNORMAL LOW (ref 8.9–10.3)
Chloride: 104 mmol/L (ref 101–111)
GFR calc Af Amer: >60 mL/min (ref >60)
GLUCOSE: 344 mg/dL — AB (ref 65–99)
Potassium: 3.5 mmol/L (ref 3.5–5.1)
Sodium: 133 mmol/L — ABNORMAL LOW (ref 135–145)
TOTAL PROTEIN: 7.2 g/dL (ref 6.5–8.1)

## 2016-04-16 NOTE — ED Notes (Signed)
Pt updated on delay, additional warm blankets provided. Pt verbalizes understanding.

## 2016-04-16 NOTE — ED Triage Notes (Signed)
Patient with moderate amount of swelling to left jaw. Patient reports that it started this evening. Patient denies any pain with the swelling.

## 2016-04-17 ENCOUNTER — Encounter: Payer: Self-pay | Admitting: Radiology

## 2016-04-17 ENCOUNTER — Emergency Department: Payer: PRIVATE HEALTH INSURANCE

## 2016-04-17 DIAGNOSIS — R220 Localized swelling, mass and lump, head: Secondary | ICD-10-CM | POA: Diagnosis not present

## 2016-04-17 LAB — GLUCOSE, CAPILLARY: Glucose-Capillary: 248 mg/dL — ABNORMAL HIGH (ref 65–99)

## 2016-04-17 MED ORDER — SODIUM CHLORIDE 0.9 % IV BOLUS (SEPSIS)
1000.0000 mL | Freq: Once | INTRAVENOUS | Status: AC
  Administered 2016-04-17: 1000 mL via INTRAVENOUS

## 2016-04-17 MED ORDER — IOPAMIDOL (ISOVUE-300) INJECTION 61%
75.0000 mL | Freq: Once | INTRAVENOUS | Status: AC | PRN
  Administered 2016-04-17: 75 mL via INTRAVENOUS

## 2016-04-17 NOTE — ED Provider Notes (Signed)
[facility] Emergency Department Provider Note   ____________________________________________   First MD Initiated Contact with Patient 04/17/16 0002     (approximate)  I have reviewed the triage vital signs and the nursing notes.   HISTORY  Chief Complaint Facial Swelling    HPI Miguel Walters is a 50 y.o. male who comes into the hospital today with a lump to the right side of his face. He reports that he noticed it on Saturday. He reports it doesn't hurt, he doesn't have any ear pain and he does not any sore throat. He has not taken any new medications. He denies any fevers, nausea, vomiting. The patient has never had this before. He also denies any chest pains or shortness of breath. The patient was concerned about this area so he decided to come into the hospital to get checked out.   Past Medical History:  Diagnosis Date  . Diabetes mellitus without complication (HCC)   . Heart murmur   . Hyperlipemia     There are no active problems to display for this patient.   Past Surgical History:  Procedure Laterality Date  . FRACTURE SURGERY      Prior to Admission medications   Medication Sig Start Date End Date Taking? Authorizing Provider  aspirin 81 MG EC tablet Take 1 tablet (81 mg total) by mouth daily. Swallow whole. 02/11/16   Emily Filbert, MD  azithromycin (ZITHROMAX Z-PAK) 250 MG tablet Take 2 tablets (500 mg) on  Day 1,  followed by 1 tablet (250 mg) once daily on Days 2 through 5. 02/11/16   Emily Filbert, MD  cholecalciferol (VITAMIN D) 1000 UNITS tablet Take 1,000 Units by mouth daily.    Historical Provider, MD  doxycycline (VIBRAMYCIN) 50 MG capsule Take 2 capsules (100 mg total) by mouth 2 (two) times daily. 04/10/16 04/17/16  Rockne Menghini, MD  loperamide (IMODIUM A-D) 2 MG tablet Take 1 tablet (2 mg total) by mouth 4 (four) times daily as needed for diarrhea or loose stools. 04/10/16   Anne-Caroline Sharma Covert, MD    metFORMIN (GLUCOPHAGE-XR) 500 MG 24 hr tablet Take 1,000 mg by mouth 2 (two) times daily.  05/20/15   Historical Provider, MD  ondansetron (ZOFRAN ODT) 4 MG disintegrating tablet Take 1 tablet (4 mg total) by mouth every 8 (eight) hours as needed for nausea or vomiting. 04/10/16   Rockne Menghini, MD    Allergies Oxycodone and Hydrocodone  No family history on file.  Social History Social History  Substance Use Topics  . Smoking status: Former Smoker    Quit date: 02/23/2007  . Smokeless tobacco: Never Used  . Alcohol use No    Review of Systems Constitutional: No fever/chills Eyes: No visual changes. ENT: Right sided jaw swelling Cardiovascular: Denies chest pain. Respiratory: Denies shortness of breath. Gastrointestinal: No abdominal pain.  No nausea, no vomiting.  No diarrhea.  No constipation. Genitourinary: Negative for dysuria. Musculoskeletal: Negative for back pain. Skin: Negative for rash. Neurological: Negative for headaches, focal weakness or numbness.  10-point ROS otherwise negative.  ____________________________________________   PHYSICAL EXAM:  VITAL SIGNS: ED Triage Vitals  Enc Vitals Group     BP 04/16/16 2025 132/85     Pulse Rate 04/16/16 2025 (!) 58     Resp 04/16/16 2025 18     Temp 04/16/16 2025 98.2 F (36.8 C)     Temp Source 04/16/16 2025 Oral     SpO2 04/16/16 2025 98 %  Weight 04/16/16 2026 185 lb (83.9 kg)     Height 04/16/16 2026 5\' 7"  (1.702 m)     Head Circumference --      Peak Flow --      Pain Score 04/16/16 2256 0     Pain Loc --      Pain Edu? --      Excl. in GC? --     Constitutional: Alert and oriented. Well appearing and in no acute distress. Eyes: Conjunctivae are normal. PERRL. EOMI. Head: Atraumatic. Nose: No congestion/rhinnorhea. Mouth/Throat: Mucous membranes are moist.  Oropharynx non-erythematous. Neck: Soft tissue swelling along the angle of the mandible. Lymph node palpated with no tenderness to  palpation Cardiovascular: Normal rate, regular rhythm. Grossly normal heart sounds.  Good peripheral circulation. Respiratory: Normal respiratory effort.  No retractions. Lungs CTAB. Gastrointestinal: Soft and nontender. No distention. Positive bowel sounds Musculoskeletal: No lower extremity tenderness nor edema.   Neurologic:  Normal speech and language.  Skin:  Skin is warm, dry and intact. Marland Kitchen. Psychiatric: Mood and affect are normal.   ____________________________________________   LABS (all labs ordered are listed, but only abnormal results are displayed)  Labs Reviewed  COMPREHENSIVE METABOLIC PANEL - Abnormal; Notable for the following:       Result Value   Sodium 133 (*)    Glucose, Bld 344 (*)    Calcium 8.8 (*)    Alkaline Phosphatase 163 (*)    All other components within normal limits  GLUCOSE, CAPILLARY - Abnormal; Notable for the following:    Glucose-Capillary 248 (*)    All other components within normal limits  CBC WITH DIFFERENTIAL/PLATELET  CBG MONITORING, ED   ____________________________________________  EKG  None ____________________________________________  RADIOLOGY  CT soft tissue neck ____________________________________________   PROCEDURES  Procedure(s) performed: None  Procedures  Critical Care performed: No  ____________________________________________   INITIAL IMPRESSION / ASSESSMENT AND PLAN / ED COURSE  Pertinent labs & imaging results that were available during my care of the patient were reviewed by me and considered in my medical decision making (see chart for details).  This is a 50 year old male who comes into the hospital today with a lump to the right side of his face. The patient reports he noticed it yesterday. I will send the patient for a CT scan to evaluate for possible abscess or fluid collection. I will also give the patient a liter of normal saline for hyperglycemia.  Clinical Course  Value Comment By Time  CT  Soft Tissue Neck W Contrast 1. No acute abnormality identified within the soft tissues of the neck. No significant inflammatory changes identified. No abscess. No findings to explain patient's right-sided jaw swelling. 2. 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   Rebecka ApleyAllison P Cherith Tewell, MD 09/03 0202   Ace and CT scan does not indicate any abscess or enlarged lymph nodes. The patient also has no tenderness. I will discharge the patient to have him follow-up with ENT. It is always a possibility that this may be a lymph node that is not seen or swelling of the patient's parotid gland. After the fluids the patient's blood sugars down to 248. He'll be discharged home to follow-up.  ____________________________________________   FINAL CLINICAL IMPRESSION(S) / ED DIAGNOSES  Final diagnoses:  Facial swelling  Neck swelling      NEW MEDICATIONS STARTED DURING THIS VISIT:  New Prescriptions   No medications on file     Note:  This document was prepared using Dragon voice recognition software  and may include unintentional dictation errors.    Rebecka Apley, MD 04/17/16 [phone]

## 2016-04-17 NOTE — ED Notes (Signed)
Pt updated on wait time for ct results. Pt verbalizes understanding.

## 2016-06-08 ENCOUNTER — Emergency Department
Admission: EM | Admit: 2016-06-08 | Discharge: 2016-06-08 | Disposition: A | Discharge status: Home / Self Care | Payer: PRIVATE HEALTH INSURANCE | Attending: Emergency Medicine | Admitting: Emergency Medicine

## 2016-06-08 ENCOUNTER — Emergency Department: Payer: PRIVATE HEALTH INSURANCE

## 2016-06-08 ENCOUNTER — Encounter: Payer: Self-pay | Admitting: *Deleted

## 2016-06-08 DIAGNOSIS — Z794 Long term (current) use of insulin: Secondary | ICD-10-CM | POA: Diagnosis not present

## 2016-06-08 DIAGNOSIS — Z87891 Personal history of nicotine dependence: Secondary | ICD-10-CM | POA: Insufficient documentation

## 2016-06-08 DIAGNOSIS — Z7982 Long term (current) use of aspirin: Secondary | ICD-10-CM | POA: Insufficient documentation

## 2016-06-08 DIAGNOSIS — E119 Type 2 diabetes mellitus without complications: Secondary | ICD-10-CM | POA: Diagnosis not present

## 2016-06-08 DIAGNOSIS — R1013 Epigastric pain: Secondary | ICD-10-CM

## 2016-06-08 DIAGNOSIS — Z79899 Other long term (current) drug therapy: Secondary | ICD-10-CM | POA: Diagnosis not present

## 2016-06-08 DIAGNOSIS — Z7984 Long term (current) use of oral hypoglycemic drugs: Secondary | ICD-10-CM | POA: Diagnosis not present

## 2016-06-08 HISTORY — DX: Gastric ulcer, unspecified as acute or chronic, without hemorrhage or perforation: K25.9

## 2016-06-08 LAB — URINALYSIS COMPLETE WITH MICROSCOPIC (ARMC ONLY)
BILIRUBIN URINE: NEGATIVE
Bacteria, UA: NONE SEEN
Glucose, UA: >500 mg/dL — AB
HGB URINE DIPSTICK: NEGATIVE
LEUKOCYTES UA: NEGATIVE
NITRITE: NEGATIVE
PH: 5 (ref 5.0–8.0)
PROTEIN: NEGATIVE mg/dL
SPECIFIC GRAVITY, URINE: 1.022 (ref 1.005–1.030)
Squamous Epithelial / LPF: NONE SEEN

## 2016-06-08 LAB — COMPREHENSIVE METABOLIC PANEL
ALT: 19 U/L (ref 17–63)
AST: 19 U/L (ref 15–41)
Albumin: 4.2 g/dL (ref 3.5–5.0)
Alkaline Phosphatase: 106 U/L (ref 38–126)
Anion gap: 7 (ref 5–15)
BILIRUBIN TOTAL: 0.5 mg/dL (ref 0.3–1.2)
BUN: 8 mg/dL (ref 6–20)
CHLORIDE: 102 mmol/L (ref 101–111)
CO2: 28 mmol/L (ref 22–32)
CREATININE: 0.74 mg/dL (ref 0.61–1.24)
Calcium: 9.3 mg/dL (ref 8.9–10.3)
Glucose, Bld: 257 mg/dL — ABNORMAL HIGH (ref 65–99)
POTASSIUM: 3.8 mmol/L (ref 3.5–5.1)
Sodium: 137 mmol/L (ref 135–145)
TOTAL PROTEIN: 7.1 g/dL (ref 6.5–8.1)

## 2016-06-08 LAB — CBC
HCT: 41.9 % (ref 40.0–52.0)
Hemoglobin: 14.4 g/dL (ref 13.0–18.0)
MCH: 29.7 pg (ref 26.0–34.0)
MCHC: 34.4 g/dL (ref 32.0–36.0)
MCV: 86.4 fL (ref 80.0–100.0)
PLATELETS: 180 10*3/uL (ref 150–440)
RBC: 4.85 MIL/uL (ref 4.40–5.90)
RDW: 13.3 % (ref 11.5–14.5)
WBC: 4.6 10*3/uL (ref 3.8–10.6)

## 2016-06-08 LAB — LIPASE, BLOOD: Lipase: 19 U/L (ref 11–51)

## 2016-06-08 MED ORDER — SUCRALFATE 1 G PO TABS: 1.0000 g | ORAL_TABLET | Freq: Four times a day (QID) | ORAL | 0 refills | Status: DC

## 2016-06-08 MED ORDER — GI COCKTAIL ~~LOC~~
30.0000 mL | Freq: Once | ORAL | Status: AC
  Administered 2016-06-08: 30 mL via ORAL
  Filled 2016-06-08: qty 30

## 2016-06-08 MED ORDER — OMEPRAZOLE 40 MG PO CPDR: 40.0000 mg | DELAYED_RELEASE_CAPSULE | Freq: Every day | ORAL | 0 refills | Status: DC

## 2016-06-08 NOTE — ED Provider Notes (Signed)
[facility] Emergency Department Provider Note   ____________________________________________   First MD Initiated Contact with Patient 06/08/16 1606     (approximate)  I have reviewed the triage vital signs and the nursing notes.   HISTORY  Chief Complaint Abdominal Pain   HPI Miguel Walters is a 50 y.o. male with a history of diabetes and gastric ulcers who is presenting to the emergency department today with epigastric abdominal pain has been intermittent over the past month. He says it does not worsen with eating and he has had some nausea but no vomiting. Denies any diarrhea. Says the pain is sharp and nonradiating. Says it is a 10 out of 10 when it is there. Says that he is currently having a 10 out of 10 pain at this time. Not on any antacids. Does not have a gastroenterologist that he sees regularly.   Past Medical History:  Diagnosis Date  . Diabetes mellitus without complication (HCC)   . Heart murmur   . Hyperlipemia   . Multiple gastric ulcers     There are no active problems to display for this patient.   Past Surgical History:  Procedure Laterality Date  . FRACTURE SURGERY      Prior to Admission medications   Medication Sig Start Date End Date Taking? Authorizing Provider  aspirin 81 MG EC tablet Take 1 tablet (81 mg total) by mouth daily. Swallow whole. 02/11/16   Emily Filbert, MD  azithromycin (ZITHROMAX Z-PAK) 250 MG tablet Take 2 tablets (500 mg) on  Day 1,  followed by 1 tablet (250 mg) once daily on Days 2 through 5. 02/11/16   Emily Filbert, MD  cholecalciferol (VITAMIN D) 1000 UNITS tablet Take 1,000 Units by mouth daily.    Historical Provider, MD  loperamide (IMODIUM A-D) 2 MG tablet Take 1 tablet (2 mg total) by mouth 4 (four) times daily as needed for diarrhea or loose stools. 04/10/16   Anne-Caroline Sharma Covert, MD  metFORMIN (GLUCOPHAGE-XR) 500 MG 24 hr tablet Take 1,000 mg by mouth 2 (two) times daily.  05/20/15    Historical Provider, MD  ondansetron (ZOFRAN ODT) 4 MG disintegrating tablet Take 1 tablet (4 mg total) by mouth every 8 (eight) hours as needed for nausea or vomiting. 04/10/16   Rockne Menghini, MD    Allergies Oxycodone and Hydrocodone  History reviewed. No pertinent family history.  Social History Social History  Substance Use Topics  . Smoking status: Former Smoker    Quit date: 02/23/2007  . Smokeless tobacco: Never Used  . Alcohol use No    Review of Systems Constitutional: No fever/chills Eyes: No visual changes. ENT: No sore throat. Cardiovascular: Denies chest pain. Respiratory: Denies shortness of breath. Gastrointestinal:no vomiting.  No diarrhea.  No constipation. Genitourinary: Negative for dysuria. Musculoskeletal: Negative for back pain. Skin: Negative for rash. Neurological: Negative for headaches, focal weakness or numbness.  10-point ROS otherwise negative.  ____________________________________________   PHYSICAL EXAM:  VITAL SIGNS: ED Triage Vitals  Enc Vitals Group     BP 06/08/16 1429 130/82     Pulse Rate 06/08/16 1429 61     Resp 06/08/16 1429 16     Temp 06/08/16 1429 98.4 F (36.9 C)     Temp Source 06/08/16 1429 Oral     SpO2 06/08/16 1429 98 %     Weight 06/08/16 1430 175 lb (79.4 kg)     Height 06/08/16 1430 5\' 7"  (1.702 m)  Head Circumference --      Peak Flow --      Pain Score 06/08/16 1430 0     Pain Loc --      Pain Edu? --      Excl. in GC? --     Constitutional: Alert and oriented. Well appearing and in no acute distress. Eyes: Conjunctivae are normal. PERRL. EOMI. Head: Atraumatic. Nose: No congestion/rhinnorhea. Mouth/Throat: Mucous membranes are moist.   Neck: No stridor.   Cardiovascular: Normal rate, regular rhythm. Grossly normal heart sounds.  Respiratory: Normal respiratory effort.  No retractions. Lungs CTAB. Gastrointestinal: Soft with mild epigastric tenderness palpation. No distention. No CVA  tenderness. Musculoskeletal: No lower extremity tenderness nor edema.  No joint effusions. Neurologic:  Normal speech and language. No gross focal neurologic deficits are appreciated.  Skin:  Skin is warm, dry and intact. No rash noted. Psychiatric: Mood and affect are normal. Speech and behavior are normal.  ____________________________________________   LABS (all labs ordered are listed, but only abnormal results are displayed)  Labs Reviewed  COMPREHENSIVE METABOLIC PANEL - Abnormal; Notable for the following:       Result Value   Glucose, Bld 257 (*)    All other components within normal limits  URINALYSIS COMPLETEWITH MICROSCOPIC ([facility] ONLY) - Abnormal; Notable for the following:    Color, Urine YELLOW (*)    APPearance CLEAR (*)    Glucose, UA >500 (*)    Ketones, ur TRACE (*)    All other components within normal limits  LIPASE, BLOOD  CBC   ____________________________________________  EKG   ____________________________________________  RADIOLOGY  DG Chest 1 View (Accession [id number]) (Order [id number])  Imaging  Date: 06/08/2016 Department: [facility] EMERGENCY DEPARTMENT Released By/Authorizing: Myrna Blazer, MD (auto-released)  Exam Information   Status Exam Begun  Exam Ended   Final [99] 06/08/2016 6:25 PM 06/08/2016 6:28 PM  PACS Images   Show images for DG Chest 1 View  Study Result   CLINICAL DATA:  Upper abdominal pain x1 month, nausea  EXAM: CHEST 1 VIEW  COMPARISON:  02/14/2016  FINDINGS: Lungs are clear.  No pleural effusion or pneumothorax.  The heart is normal in size.  IMPRESSION: No evidence of acute cardiopulmonary disease.   Electronically Signed   By: Charline Bills M.D.   On: 06/08/2016 18:37     ____________________________________________   PROCEDURES  Procedure(s) performed:   Procedures  Critical Care performed:    ____________________________________________   INITIAL IMPRESSION / ASSESSMENT AND PLAN / ED COURSE  Pertinent labs & imaging results that were available during my care of the patient were reviewed by me and considered in my medical decision making (see chart for details).  ----------------------------------------- 6:30 PM on 06/08/2016 -----------------------------------------  Patient without any relief after GI cocktail but still without any distress. He is calm without any signs of outward pain. He is lying on his bed watching television. I reexamined his abdomen and he is still with very minimal tenderness in the epigastrium. I will check a chest x-ray for any free air under the diaphragm. However, this is negative I believe the patient will most likely benefit from treatment for gastric pain likely from a stomach ulcer and follow up with gastrology. I explained this plan and he is understanding and will comply. Patient just had a CAT scan in late August of the abdomen without any acute pathology.  Clinical Course    ----------------------------------------- 7:03 PM on 06/08/2016 -----------------------------------------  Patient continues  to be resting comfortably at this time. No distress. No vomiting in the emergency department. We'll discharge with PPI as well as sucralfate. He knows that he must follow-up with a gastroenterologist for likely endoscopy for further workup. He is understanding of this plan and willing to comply. ____________________________________________   FINAL CLINICAL IMPRESSION(S) / ED DIAGNOSES  Epigastric pain.    NEW MEDICATIONS STARTED DURING THIS VISIT:  New Prescriptions   No medications on file     Note:  This document was prepared using Dragon voice recognition software and may include unintentional dictation errors.    Myrna Blazeravid Matthew Vineta Carone, MD 06/08/16 [phone]

## 2016-06-08 NOTE — ED Triage Notes (Addendum)
Pt arrived to ED from home reporting centralized abd pain for the past month. Pt reports he has had nausea but denies vomiting or diarrhea. No fevers reported. No decrease in appetite reported. Pt reports the pain is sharp and burning in nature and verbalized the pain has not changed in the past month. Pt reports he can not deal with this pain any longer.   Pt reports a hx of gastric ulcers but denies vomiting blood or passing dark tarry stools.

## 2016-06-08 NOTE — ED Notes (Signed)
Discharge instructions reviewed with patient. Questions fielded by this RN. Patient verbalizes understanding of instructions. Patient discharged home in stable condition per MD . No acute distress noted at time of discharge.   

## 2016-07-16 ENCOUNTER — Emergency Department: Payer: No Typology Code available for payment source

## 2016-07-16 ENCOUNTER — Emergency Department
Admission: EM | Admit: 2016-07-16 | Discharge: 2016-07-16 | Disposition: A | Discharge status: Home / Self Care | Payer: No Typology Code available for payment source | Attending: Emergency Medicine | Admitting: Emergency Medicine

## 2016-07-16 DIAGNOSIS — Z7982 Long term (current) use of aspirin: Secondary | ICD-10-CM | POA: Diagnosis not present

## 2016-07-16 DIAGNOSIS — K2970 Gastritis, unspecified, without bleeding: Secondary | ICD-10-CM | POA: Diagnosis not present

## 2016-07-16 DIAGNOSIS — R1010 Upper abdominal pain, unspecified: Secondary | ICD-10-CM

## 2016-07-16 DIAGNOSIS — E119 Type 2 diabetes mellitus without complications: Secondary | ICD-10-CM | POA: Diagnosis not present

## 2016-07-16 DIAGNOSIS — Z794 Long term (current) use of insulin: Secondary | ICD-10-CM | POA: Insufficient documentation

## 2016-07-16 DIAGNOSIS — Z79899 Other long term (current) drug therapy: Secondary | ICD-10-CM | POA: Diagnosis not present

## 2016-07-16 DIAGNOSIS — A048 Other specified bacterial intestinal infections: Secondary | ICD-10-CM

## 2016-07-16 DIAGNOSIS — B9681 Helicobacter pylori [H. pylori] as the cause of diseases classified elsewhere: Secondary | ICD-10-CM | POA: Diagnosis not present

## 2016-07-16 DIAGNOSIS — Z87891 Personal history of nicotine dependence: Secondary | ICD-10-CM | POA: Insufficient documentation

## 2016-07-16 HISTORY — DX: Other specified bacterial intestinal infections: A04.8

## 2016-07-16 LAB — URINALYSIS COMPLETE WITH MICROSCOPIC (ARMC ONLY)
Bilirubin Urine: NEGATIVE
HGB URINE DIPSTICK: NEGATIVE
LEUKOCYTES UA: NEGATIVE
NITRITE: NEGATIVE
PH: 5 (ref 5.0–8.0)
Protein, ur: NEGATIVE mg/dL
Specific Gravity, Urine: 1.03 (ref 1.005–1.030)

## 2016-07-16 LAB — COMPREHENSIVE METABOLIC PANEL
ALBUMIN: 4.3 g/dL (ref 3.5–5.0)
ALT: 21 U/L (ref 17–63)
ANION GAP: 8 (ref 5–15)
AST: 21 U/L (ref 15–41)
Alkaline Phosphatase: 121 U/L (ref 38–126)
BILIRUBIN TOTAL: 0.9 mg/dL (ref 0.3–1.2)
BUN: 10 mg/dL (ref 6–20)
CHLORIDE: 103 mmol/L (ref 101–111)
CO2: 24 mmol/L (ref 22–32)
Calcium: 8.7 mg/dL — ABNORMAL LOW (ref 8.9–10.3)
Creatinine, Ser: 0.8 mg/dL (ref 0.61–1.24)
GFR calc Af Amer: >60 mL/min (ref >60)
GFR calc non Af Amer: >60 mL/min (ref >60)
GLUCOSE: 338 mg/dL — AB (ref 65–99)
POTASSIUM: 4 mmol/L (ref 3.5–5.1)
SODIUM: 135 mmol/L (ref 135–145)
TOTAL PROTEIN: 7.5 g/dL (ref 6.5–8.1)

## 2016-07-16 LAB — CBC
HEMATOCRIT: 44.9 % (ref 40.0–52.0)
HEMOGLOBIN: 15.6 g/dL (ref 13.0–18.0)
MCH: 30.3 pg (ref 26.0–34.0)
MCHC: 34.8 g/dL (ref 32.0–36.0)
MCV: 87.1 fL (ref 80.0–100.0)
Platelets: 184 10*3/uL (ref 150–440)
RBC: 5.16 MIL/uL (ref 4.40–5.90)
RDW: 13.4 % (ref 11.5–14.5)
WBC: 7.2 10*3/uL (ref 3.8–10.6)

## 2016-07-16 LAB — LIPASE, BLOOD: LIPASE: 21 U/L (ref 11–51)

## 2016-07-16 MED ORDER — GI COCKTAIL ~~LOC~~
30.0000 mL | ORAL | Status: AC
  Administered 2016-07-16: 30 mL via ORAL
  Filled 2016-07-16: qty 30

## 2016-07-16 MED ORDER — SUCRALFATE 1 G PO TABS: 1.0000 g | ORAL_TABLET | Freq: Four times a day (QID) | ORAL | 1 refills | Status: DC

## 2016-07-16 MED ORDER — METOCLOPRAMIDE HCL 10 MG PO TABS
10.0000 mg | ORAL_TABLET | Freq: Once | ORAL | Status: AC
  Administered 2016-07-16: 10 mg via ORAL
  Filled 2016-07-16: qty 1

## 2016-07-16 MED ORDER — METOCLOPRAMIDE HCL 10 MG PO TABS: 10.0000 mg | ORAL_TABLET | Freq: Four times a day (QID) | ORAL | 0 refills | Status: DC | PRN

## 2016-07-16 NOTE — ED Triage Notes (Signed)
Pt arrives to ER via POV c/o abdominal pain. Pt brings paperwork that shows he was dx with H. Pylori on 07/13/16. Pt currently taking antibiotics for abdominal pain, no relief. Worsening pain today. Pt alert and oriented X4, active, cooperative, pt in NAD. RR even and unlabored, color WNL.

## 2016-07-16 NOTE — ED Provider Notes (Signed)
[facility] Emergency Department Provider Note  ____________________________________________  Time seen: Approximately 11:12 PM  I have reviewed the triage vital signs and the nursing notes.   HISTORY  Chief Complaint Abdominal Pain    HPI Miguel Walters is a 50 y.o. male complains of upper abdominal pain. [facility] and diagnosed with H. pylori, started on bismuth quad therapy, complains that his symptoms are not adequately relieved yet. He is taking about half of the course of antibiotics. He's continued taking his PPI. Denies vomiting fevers chills. No acute complaints, nothing new in addition to the subacute complaints which she is artery seen primary care and then referred to GI. GI appointment is in about a month.     Past Medical History:  Diagnosis Date  . Diabetes mellitus without complication (HCC)   . H. pylori infection   . Heart murmur   . Hyperlipemia   . Multiple gastric ulcers      There are no active problems to display for this patient.    Past Surgical History:  Procedure Laterality Date  . FRACTURE SURGERY       Prior to Admission medications   Medication Sig Start Date End Date Taking? Authorizing Provider  aspirin 81 MG EC tablet Take 1 tablet (81 mg total) by mouth daily. Swallow whole. 02/11/16   Emily FilbertJonathan E Williams, MD  dicyclomine (BENTYL) 20 MG tablet Take 20 mg by mouth 4 (four) times daily.    Historical Provider, MD  insulin detemir (LEVEMIR) 100 UNIT/ML injection Inject 24 Units into the skin at bedtime.    Historical Provider, MD  metFORMIN (GLUCOPHAGE-XR) 500 MG 24 hr tablet Take 1,000 mg by mouth 2 (two) times daily.  05/20/15   Historical Provider, MD  metoCLOPramide (REGLAN) 10 MG tablet Take 1 tablet (10 mg total) by mouth every 6 (six) hours as needed. 07/16/16   Sharman CheekPhillip Arsh Feutz, MD  omeprazole (PRILOSEC) 40 MG capsule Take 1 capsule (40 mg total) by mouth daily. 06/08/16 06/08/17   Myrna Blazeravid Matthew Schaevitz, MD  sucralfate (CARAFATE) 1 g tablet Take 1 tablet (1 g total) by mouth 4 (four) times daily. 07/16/16   Sharman CheekPhillip Blimie Vaness, MD     Allergies Oxycodone and Hydrocodone   No family history on file.  Social History Social History  Substance Use Topics  . Smoking status: Former Smoker    Quit date: 02/23/2007  . Smokeless tobacco: Never Used  . Alcohol use No    Review of Systems  Constitutional:   No fever or chills.  ENT:   No sore throat. No rhinorrhea. Cardiovascular:   No chest pain. Respiratory:   No dyspnea or cough. Gastrointestinal:   Positive upper abdominal pain as above without vomiting or diarrhea  Genitourinary:   Negative for dysuria or difficulty urinating. Musculoskeletal:   Negative for focal pain or swelling Neurological:   Negative for headaches 10-point ROS otherwise negative.  ____________________________________________   PHYSICAL EXAM:  VITAL SIGNS: ED Triage Vitals [07/16/16 1854]  Enc Vitals Group     BP (!) 148/84     Pulse Rate 66     Resp 18     Temp 97.8 F (36.6 C)     Temp Source Oral     SpO2 100 %     Weight 185 lb (83.9 kg)     Height 5\' 7"  (1.702 m)     Head Circumference      Peak Flow  Pain Score 10     Pain Loc      Pain Edu?      Excl. in GC?     Vital signs reviewed, nursing assessments reviewed.   Constitutional:   Alert and oriented. Well appearing and in no distress. Eyes:   No scleral icterus. No conjunctival pallor. PERRL. EOMI.  No nystagmus. ENT   Head:   Normocephalic and atraumatic.   Nose:   No congestion/rhinnorhea. No septal hematoma   Mouth/Throat:   MMM, no pharyngeal erythema. No peritonsillar mass.    Neck:   No stridor. No SubQ emphysema. No meningismus. Hematological/Lymphatic/Immunilogical:   No cervical lymphadenopathy. Cardiovascular:   RRR. Symmetric bilateral radial and DP pulses.  No murmurs.  Respiratory:   Normal respiratory effort without  tachypnea nor retractions. Breath sounds are clear and equal bilaterally. No wheezes/rales/rhonchi. Gastrointestinal:   Soft With epigastric and left upper quadrant tenderness. Non distended. There is no CVA tenderness.  No rebound, rigidity, or guarding. Genitourinary:   deferred Musculoskeletal:   Nontender with normal range of motion in all extremities. No joint effusions.  No lower extremity tenderness.  No edema. Neurologic:   Normal speech and language.  CN 2-10 normal. Motor grossly intact. No gross focal neurologic deficits are appreciated.  Skin:    Skin is warm, dry and intact. No rash noted.  No petechiae, purpura, or bullae.  ____________________________________________    LABS (pertinent positives/negatives) (all labs ordered are listed, but only abnormal results are displayed) Labs Reviewed  COMPREHENSIVE METABOLIC PANEL - Abnormal; Notable for the following:       Result Value   Glucose, Bld 338 (*)    Calcium 8.7 (*)    All other components within normal limits  URINALYSIS COMPLETEWITH MICROSCOPIC ([facility] ONLY) - Abnormal; Notable for the following:    Color, Urine YELLOW (*)    APPearance CLEAR (*)    Glucose, UA >500 (*)    Ketones, ur TRACE (*)    Bacteria, UA RARE (*)    Squamous Epithelial / LPF 0-5 (*)    All other components within normal limits  LIPASE, BLOOD  CBC   ____________________________________________   EKG    ____________________________________________    RADIOLOGY  X-rays abdominal series unremarkable  ____________________________________________   PROCEDURES Procedures  ____________________________________________   INITIAL IMPRESSION / ASSESSMENT AND PLAN / ED COURSE  Pertinent labs & imaging results that were available during my care of the patient were reviewed by me and considered in my medical decision making (see chart for details).  Patient well appearing no acute distress, vital signs normal. On appropriate  therapy for H. pylori with bismuth, Flagyl, doxycycline, PPI. Given GI cocktail and Reglan in the ED. Workup negative today. Considering the patient's symptoms, medical history, and physical examination today, I have low suspicion for cholecystitis or biliary pathology, pancreatitis, perforation or bowel obstruction, hernia, intra-abdominal abscess, AAA or dissection, volvulus or intussusception, mesenteric ischemia, or appendicitis.  Continue Carafate and Reglan, continue follow up with primary care and GI when able.     Clinical Course    ____________________________________________   FINAL CLINICAL IMPRESSION(S) / ED DIAGNOSES  Final diagnoses:  Pain of upper abdomen  Positive H. pylori test  Gastritis, presence of bleeding unspecified, unspecified chronicity, unspecified gastritis type       Portions of this note were generated with dragon dictation software. Dictation errors may occur despite best attempts at proofreading.    Sharman CheekPhillip Zavien Clubb, MD 07/16/16 [phone]

## 2016-07-23 ENCOUNTER — Encounter: Payer: Self-pay | Admitting: Emergency Medicine

## 2016-07-23 ENCOUNTER — Emergency Department: Payer: 59

## 2016-07-23 ENCOUNTER — Emergency Department
Admission: EM | Admit: 2016-07-23 | Discharge: 2016-07-23 | Disposition: A | Discharge status: Home / Self Care | Payer: 59 | Attending: Emergency Medicine | Admitting: Emergency Medicine

## 2016-07-23 DIAGNOSIS — E119 Type 2 diabetes mellitus without complications: Secondary | ICD-10-CM | POA: Diagnosis not present

## 2016-07-23 DIAGNOSIS — Z794 Long term (current) use of insulin: Secondary | ICD-10-CM | POA: Insufficient documentation

## 2016-07-23 DIAGNOSIS — Z7982 Long term (current) use of aspirin: Secondary | ICD-10-CM | POA: Diagnosis not present

## 2016-07-23 DIAGNOSIS — R05 Cough: Secondary | ICD-10-CM | POA: Insufficient documentation

## 2016-07-23 DIAGNOSIS — R059 Cough, unspecified: Secondary | ICD-10-CM

## 2016-07-23 DIAGNOSIS — Z87891 Personal history of nicotine dependence: Secondary | ICD-10-CM | POA: Insufficient documentation

## 2016-07-23 DIAGNOSIS — R5383 Other fatigue: Secondary | ICD-10-CM | POA: Insufficient documentation

## 2016-07-23 LAB — BASIC METABOLIC PANEL
ANION GAP: 8 (ref 5–15)
BUN: 12 mg/dL (ref 6–20)
CALCIUM: 8.7 mg/dL — AB (ref 8.9–10.3)
CO2: 23 mmol/L (ref 22–32)
Chloride: 107 mmol/L (ref 101–111)
Creatinine, Ser: 0.72 mg/dL (ref 0.61–1.24)
Glucose, Bld: 282 mg/dL — ABNORMAL HIGH (ref 65–99)
POTASSIUM: 3.9 mmol/L (ref 3.5–5.1)
Sodium: 138 mmol/L (ref 135–145)

## 2016-07-23 LAB — CBC WITH DIFFERENTIAL/PLATELET
BASOS ABS: 0 10*3/uL (ref 0–0.1)
BASOS PCT: 1 %
EOS PCT: 2 %
Eosinophils Absolute: 0.1 10*3/uL (ref 0–0.7)
HEMATOCRIT: 43.8 % (ref 40.0–52.0)
Hemoglobin: 15 g/dL (ref 13.0–18.0)
LYMPHS PCT: 31 %
Lymphs Abs: 2.2 10*3/uL (ref 1.0–3.6)
MCH: 30.1 pg (ref 26.0–34.0)
MCHC: 34.2 g/dL (ref 32.0–36.0)
MCV: 87.9 fL (ref 80.0–100.0)
MONO ABS: 0.6 10*3/uL (ref 0.2–1.0)
Monocytes Relative: 8 %
NEUTROS ABS: 4.2 10*3/uL (ref 1.4–6.5)
Neutrophils Relative %: 58 %
PLATELETS: 173 10*3/uL (ref 150–440)
RBC: 4.99 MIL/uL (ref 4.40–5.90)
RDW: 13.3 % (ref 11.5–14.5)
WBC: 7.1 10*3/uL (ref 3.8–10.6)

## 2016-07-23 LAB — CARBOXYHEMOGLOBIN - COOX: CARBOXYHEMOGLOBIN: 2.2 % — AB (ref 0.5–1.5)

## 2016-07-23 LAB — TROPONIN I

## 2016-07-23 LAB — ETHANOL: Alcohol, Ethyl (B): <5 mg/dL (ref <5)

## 2016-07-23 MED ORDER — SODIUM CHLORIDE 0.9 % IV BOLUS (SEPSIS)
500.0000 mL | Freq: Once | INTRAVENOUS | Status: AC
  Administered 2016-07-23: 500 mL via INTRAVENOUS

## 2016-07-23 NOTE — Discharge Instructions (Signed)
At this time, carbon monoxide levels are certainly not dangerous here. If you have concerned about ongoing carbon monoxide make sure that you're, monoxide detectors are working, and that you talk to the fire department for a check. If you're worried about that tonight, you may certainly spend the night somewhere else. However, at this time we do not see any evidence of significant carbon monoxide poisoning. If you have any new or worrisome symptoms however return to the emergency room.

## 2016-07-23 NOTE — ED Notes (Signed)
Called RT of [facility], unable to come at this time bc they are in a code
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
## 2016-09-11 ENCOUNTER — Emergency Department
Admission: EM | Admit: 2016-09-11 | Discharge: 2016-09-11 | Disposition: A | Discharge status: Home / Self Care | Payer: PRIVATE HEALTH INSURANCE | Attending: Emergency Medicine | Admitting: Emergency Medicine

## 2016-09-11 DIAGNOSIS — Z79899 Other long term (current) drug therapy: Secondary | ICD-10-CM | POA: Insufficient documentation

## 2016-09-11 DIAGNOSIS — Z794 Long term (current) use of insulin: Secondary | ICD-10-CM | POA: Insufficient documentation

## 2016-09-11 DIAGNOSIS — Z7982 Long term (current) use of aspirin: Secondary | ICD-10-CM | POA: Insufficient documentation

## 2016-09-11 DIAGNOSIS — Y929 Unspecified place or not applicable: Secondary | ICD-10-CM | POA: Insufficient documentation

## 2016-09-11 DIAGNOSIS — E119 Type 2 diabetes mellitus without complications: Secondary | ICD-10-CM | POA: Insufficient documentation

## 2016-09-11 DIAGNOSIS — Y990 Civilian activity done for income or pay: Secondary | ICD-10-CM | POA: Insufficient documentation

## 2016-09-11 DIAGNOSIS — X503XXA Overexertion from repetitive movements, initial encounter: Secondary | ICD-10-CM | POA: Insufficient documentation

## 2016-09-11 DIAGNOSIS — Z87891 Personal history of nicotine dependence: Secondary | ICD-10-CM | POA: Insufficient documentation

## 2016-09-11 DIAGNOSIS — M7711 Lateral epicondylitis, right elbow: Secondary | ICD-10-CM | POA: Insufficient documentation

## 2016-09-11 DIAGNOSIS — Y9389 Activity, other specified: Secondary | ICD-10-CM | POA: Insufficient documentation

## 2016-09-11 MED ORDER — OXYCODONE-ACETAMINOPHEN 5-325 MG PO TABS: 1.0000 | ORAL_TABLET | Freq: Once | ORAL | Status: DC

## 2016-09-11 MED ORDER — KETOROLAC TROMETHAMINE 30 MG/ML IJ SOLN: 30.0000 mg | Freq: Once | INTRAMUSCULAR | Status: DC

## 2016-09-11 MED ORDER — KETOROLAC TROMETHAMINE 60 MG/2ML IM SOLN
30.0000 mg | Freq: Once | INTRAMUSCULAR | Status: AC
  Administered 2016-09-11: 30 mg via INTRAMUSCULAR
  Filled 2016-09-11: qty 2

## 2016-09-11 MED ORDER — NAPROXEN 375 MG PO TABS: 375.0000 mg | ORAL_TABLET | Freq: Two times a day (BID) | ORAL | 0 refills | Status: AC

## 2016-09-11 NOTE — ED Notes (Signed)
Patient with complaint of pain to right elbow that shoots to his right hand times one month. Patient denies any injury. Patient with positive radial pulses.

## 2016-09-11 NOTE — ED Provider Notes (Signed)
[facility] Emergency Department Provider Note  ____________________________________________   I have reviewed the triage vital signs and the nursing notes.   HISTORY  Chief Complaint Arm Pain    HPI MEMPHIS DECOTEAU is a 51 y.o. 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 Gradually over the last month he is noted that he has constant pain in his right lateral epicondylar area. It is worse when he is doing his motions with his arm at his job. It is not exertional he has no chest pain or shortness of breath, and hurts when he touches it or changes position. Denies any numbness or weakness. Sometimes it seems to radiate down his arm towards his hand. He has had no trauma to the area. Denies any fever or rash.       Past Medical History:  Diagnosis Date  . Diabetes mellitus without complication (HCC)   . H. pylori infection   . Heart murmur   . Hyperlipemia   . Multiple gastric ulcers     There are no active problems to display for this patient.   Past Surgical History:  Procedure Laterality Date  . FRACTURE SURGERY      Prior to Admission medications   Medication Sig Start Date End Date Taking? Authorizing Provider  aspirin 81 MG EC tablet Take 1 tablet (81 mg total) by mouth daily. Swallow whole. 02/11/16   Emily Filbert, MD  dicyclomine (BENTYL) 20 MG tablet Take 20 mg by mouth 4 (four) times daily.    Historical Provider, MD  insulin detemir (LEVEMIR) 100 UNIT/ML injection Inject 24 Units into the skin at bedtime.    Historical Provider, MD  metFORMIN (GLUCOPHAGE-XR) 500 MG 24 hr tablet Take 1,000 mg by mouth 2 (two) times daily.  05/20/15   Historical Provider, MD  metoCLOPramide (REGLAN) 10 MG tablet Take 1 tablet (10 mg total) by mouth every 6 (six) hours as needed. 07/16/16   Sharman Cheek, MD  omeprazole (PRILOSEC) 40 MG capsule Take 1 capsule (40 mg total) by mouth daily. 06/08/16 06/08/17   Myrna Blazer, MD  sucralfate (CARAFATE) 1 g tablet Take 1 tablet (1 g total) by mouth 4 (four) times daily. 07/16/16   Sharman Cheek, MD    Allergies Oxycodone and Hydrocodone  No family history on file.  Social History Social History  Substance Use Topics  . Smoking status: Former Smoker    Quit date: 02/23/2007  . Smokeless tobacco: Never Used  . Alcohol use No    Review of Systems Constitutional: No fever/chills Eyes: No visual changes. ENT: No sore throat. No stiff neck no neck pain Cardiovascular: Denies chest pain. Respiratory: Denies shortness of breath. Gastrointestinal:   no vomiting.  No diarrhea.  No constipation. Genitourinary: Negative for dysuria. Musculoskeletal: Negative lower extremity swelling Skin: Negative for rash. Neurological: Negative for severe headaches, focal weakness or numbness. 10-point ROS otherwise negative.  ____________________________________________   PHYSICAL EXAM:  VITAL SIGNS: ED Triage Vitals  Enc Vitals Group     BP 09/11/16 0115 131/82     Pulse Rate 09/11/16 0115 61     Resp 09/11/16 0115 18     Temp 09/11/16 0115 97.6 F (36.4 C)     Temp Source 09/11/16 0115 Oral     SpO2 09/11/16 0115 97 %     Weight 09/11/16 0116 197 lb (89.4 kg)     Height 09/11/16 0116 5\' 7"  (  1.702 m)     Head Circumference --      Peak Flow --      Pain Score 09/11/16 0116 10     Pain Loc --      Pain Edu? --      Excl. in GC? --     Constitutional: Alert and oriented. Well appearing and in no acute distress. Eyes: Conjunctivae are normal. PERRL. EOMI. Head: Atraumatic. Nose: No congestion/rhinnorhea. Mouth/Throat: Mucous membranes are moist.  Oropharynx non-erythematous. Neck: No stridor.   Nontender with no meningismus Cardiovascular: Normal rate, regular rhythm. Grossly normal heart sounds.  Good peripheral circulation. Respiratory: Normal respiratory effort.  No retractions. Lungs CTAB. Abdominal: Soft and nontender. No  distention. No guarding no rebound Back:  There is no focal tenderness or step off.  there is no midline tenderness there are no lesions noted. there is no CVA tenderness Musculoskeletal: No lower extremity tenderness, There is tenderness to palpation to the tendons just distal to the right lateral condyle. When patient supinates and pronates it makes the pain worse. He has strong distal pulses, there is no bony tenderness. He has full range of motion of the elbow. There is nothing red or hot to touch involved. There is no evidence of abscess. Compartments are soft. There is no wrist or shoulder discomfort No joint effusions, no DVT signs strong distal pulses no edema Neurologic:  Normal speech and language. No gross focal neurologic deficits are appreciated.  Skin:  Skin is warm, dry and intact. No rash noted. Psychiatric: Mood and affect are normal. Speech and behavior are normal.  ____________________________________________   LABS (all labs ordered are listed, but only abnormal results are displayed)  Labs Reviewed - No data to display ____________________________________________  EKG  I personally interpreted any EKGs ordered by me or triage  ____________________________________________  RADIOLOGY  I reviewed any imaging ordered by me or triage that were performed during my shift and, if possible, patient and/or family made aware of any abnormal findings. ____________________________________________   PROCEDURES  Procedure(s) performed: None  Procedures  Critical Care performed: None  ____________________________________________   INITIAL IMPRESSION / ASSESSMENT AND PLAN / ED COURSE  Pertinent labs & imaging results that were available during my care of the patient were reviewed by me and considered in my medical decision making (see chart for details).  He shouldn't with over one month possibly 6 weeks now that he thinks about it of right lateral condylar forearm pain  which is getting worse as he does his repetitive motions at his job. I do not believe this represents ACS PE or dissection myocarditis endocarditis pericarditis pneumonia and pneumothorax or any evidence of shingles or infection. There is no evidence of bony tenderness or traumatic injury to the elbow. There is no evidence of joint infection is no evidence of compartment syndrome, there is no evidence of shingles. While I cannot definitively rule out a radiculopathy or a peripheral neurologic pathology, I have low suspicion. He is neurologically intact and it does not seem to concur with the symptoms. This is most consistent with a lateral epicondylitis, and will require NSAIDs, decreased use an orthopedic follow-up. We will give him nonsteroidal pain medication and we will advise close outpatient follow-up we will give him a sling for comfort.    ____________________________________________   FINAL CLINICAL IMPRESSION(S) / ED DIAGNOSES  Final diagnoses:  None      This chart was dictated using voice recognition software.  Despite best efforts to proofread,  errors can occur which can change meaning.      Jeanmarie Plant, MD 09/11/16 [phone]

## 2016-09-11 NOTE — ED Triage Notes (Signed)
Patient reports right forearm pain for approximately one month.  Patient denies any type of injury.  Temperature between right arm and left arm are the same.  Good right radial pulse, cap refill within normal limits.

## 2016-09-11 NOTE — ED Notes (Signed)
ED Provider at bedside. 

## 2016-10-10 ENCOUNTER — Ambulatory Visit
Admission: RE | Admit: 2016-10-10 | Discharge: 2016-10-10 | Disposition: A | Discharge status: Home / Self Care | Payer: 59 | Source: Ambulatory Visit | Attending: Internal Medicine | Admitting: Internal Medicine

## 2016-10-10 ENCOUNTER — Encounter: Payer: Self-pay | Admitting: *Deleted

## 2016-10-10 ENCOUNTER — Encounter
Admission: RE | Disposition: A | Discharge status: Home / Self Care | Payer: Self-pay | Source: Ambulatory Visit | Attending: Internal Medicine

## 2016-10-10 DIAGNOSIS — Z833 Family history of diabetes mellitus: Secondary | ICD-10-CM | POA: Insufficient documentation

## 2016-10-10 DIAGNOSIS — Z825 Family history of asthma and other chronic lower respiratory diseases: Secondary | ICD-10-CM | POA: Diagnosis not present

## 2016-10-10 DIAGNOSIS — Z885 Allergy status to narcotic agent status: Secondary | ICD-10-CM | POA: Diagnosis not present

## 2016-10-10 DIAGNOSIS — E785 Hyperlipidemia, unspecified: Secondary | ICD-10-CM | POA: Insufficient documentation

## 2016-10-10 DIAGNOSIS — Z87891 Personal history of nicotine dependence: Secondary | ICD-10-CM | POA: Diagnosis not present

## 2016-10-10 DIAGNOSIS — E119 Type 2 diabetes mellitus without complications: Secondary | ICD-10-CM | POA: Insufficient documentation

## 2016-10-10 DIAGNOSIS — Z794 Long term (current) use of insulin: Secondary | ICD-10-CM | POA: Diagnosis not present

## 2016-10-10 DIAGNOSIS — Z8249 Family history of ischemic heart disease and other diseases of the circulatory system: Secondary | ICD-10-CM | POA: Diagnosis not present

## 2016-10-10 DIAGNOSIS — K219 Gastro-esophageal reflux disease without esophagitis: Secondary | ICD-10-CM | POA: Diagnosis not present

## 2016-10-10 DIAGNOSIS — R0789 Other chest pain: Secondary | ICD-10-CM | POA: Insufficient documentation

## 2016-10-10 DIAGNOSIS — Z79899 Other long term (current) drug therapy: Secondary | ICD-10-CM | POA: Insufficient documentation

## 2016-10-10 DIAGNOSIS — Z8711 Personal history of peptic ulcer disease: Secondary | ICD-10-CM | POA: Insufficient documentation

## 2016-10-10 DIAGNOSIS — Z800 Family history of malignant neoplasm of digestive organs: Secondary | ICD-10-CM | POA: Insufficient documentation

## 2016-10-10 DIAGNOSIS — R0602 Shortness of breath: Secondary | ICD-10-CM

## 2016-10-10 DIAGNOSIS — R011 Cardiac murmur, unspecified: Secondary | ICD-10-CM | POA: Insufficient documentation

## 2016-10-10 DIAGNOSIS — I10 Essential (primary) hypertension: Secondary | ICD-10-CM | POA: Insufficient documentation

## 2016-10-10 DIAGNOSIS — M729 Fibroblastic disorder, unspecified: Secondary | ICD-10-CM | POA: Insufficient documentation

## 2016-10-10 DIAGNOSIS — R1310 Dysphagia, unspecified: Secondary | ICD-10-CM | POA: Diagnosis not present

## 2016-10-10 DIAGNOSIS — R079 Chest pain, unspecified: Secondary | ICD-10-CM

## 2016-10-10 HISTORY — PX: LEFT HEART CATH AND CORONARY ANGIOGRAPHY: CATH118249

## 2016-10-10 LAB — GLUCOSE, CAPILLARY: GLUCOSE-CAPILLARY: 89 mg/dL (ref 65–99)

## 2016-10-10 LAB — CARDIAC CATHETERIZATION: Cath EF Quantitative: 60 %

## 2016-10-10 SURGERY — LEFT HEART CATH AND CORONARY ANGIOGRAPHY
Anesthesia: Moderate Sedation

## 2016-10-10 SURGERY — LEFT HEART CATH AND CORONARY ANGIOGRAPHY
Anesthesia: Moderate Sedation | Laterality: Left

## 2016-10-10 MED ORDER — ASPIRIN 81 MG PO CHEW: 81.0000 mg | CHEWABLE_TABLET | ORAL | Status: DC

## 2016-10-10 MED ORDER — MIDAZOLAM HCL 2 MG/2ML IJ SOLN
INTRAMUSCULAR | Status: AC
  Filled 2016-10-10: qty 2

## 2016-10-10 MED ORDER — SODIUM CHLORIDE 0.9 % WEIGHT BASED INFUSION
3.0000 mL/kg/h | INTRAVENOUS | Status: DC
  Administered 2016-10-10: 3 mL/kg/h via INTRAVENOUS

## 2016-10-10 MED ORDER — SODIUM CHLORIDE 0.9 % IV SOLN: 250.0000 mL | INTRAVENOUS | Status: DC | PRN

## 2016-10-10 MED ORDER — SODIUM CHLORIDE 0.9% FLUSH: 3.0000 mL | INTRAVENOUS | Status: DC | PRN

## 2016-10-10 MED ORDER — IOPAMIDOL (ISOVUE-300) INJECTION 61%
INTRAVENOUS | Status: DC | PRN
  Administered 2016-10-10: 130 mL via INTRA_ARTERIAL

## 2016-10-10 MED ORDER — SODIUM CHLORIDE 0.9% FLUSH: 3.0000 mL | Freq: Two times a day (BID) | INTRAVENOUS | Status: DC

## 2016-10-10 MED ORDER — SODIUM CHLORIDE 0.9 % IV SOLN
250.0000 mL | INTRAVENOUS | Status: DC | PRN
Start: 2016-10-10 — End: 2016-10-10

## 2016-10-10 MED ORDER — SODIUM CHLORIDE 0.9 % WEIGHT BASED INFUSION: 1.0000 mL/kg/h | INTRAVENOUS | Status: DC

## 2016-10-10 MED ORDER — ACETAMINOPHEN 325 MG PO TABS: 650.0000 mg | ORAL_TABLET | ORAL | Status: DC | PRN

## 2016-10-10 MED ORDER — HEPARIN (PORCINE) IN NACL 2-0.9 UNIT/ML-% IJ SOLN
INTRAMUSCULAR | Status: AC
  Filled 2016-10-10: qty 500

## 2016-10-10 MED ORDER — ONDANSETRON HCL 4 MG/2ML IJ SOLN: 4.0000 mg | Freq: Four times a day (QID) | INTRAMUSCULAR | Status: DC | PRN

## 2016-10-10 MED ORDER — SODIUM CHLORIDE 0.9% FLUSH
3.0000 mL | INTRAVENOUS | Status: DC | PRN
Start: 2016-10-11 — End: 2016-10-10

## 2016-10-10 MED ORDER — FENTANYL CITRATE (PF) 100 MCG/2ML IJ SOLN
INTRAMUSCULAR | Status: AC
  Filled 2016-10-10: qty 2

## 2016-10-10 MED ORDER — MIDAZOLAM HCL 2 MG/2ML IJ SOLN
INTRAMUSCULAR | Status: DC | PRN
  Administered 2016-10-10: 1 mg via INTRAVENOUS

## 2016-10-10 MED ORDER — FENTANYL CITRATE (PF) 100 MCG/2ML IJ SOLN
INTRAMUSCULAR | Status: DC | PRN
  Administered 2016-10-10: 50 ug via INTRAVENOUS

## 2016-10-10 SURGICAL SUPPLY — 9 items
CATH 5FR JL4 DIAGNOSTIC (CATHETERS) ×2 IMPLANT
CATH 5FR JR4 DIAGNOSTIC (CATHETERS) ×2 IMPLANT
CATH INFINITI 5FR ANG PIGTAIL (CATHETERS) ×2 IMPLANT
DEVICE CLOSURE MYNXGRIP 5F (Vascular Products) ×2 IMPLANT
KIT MANI 3VAL PERCEP (MISCELLANEOUS) ×2 IMPLANT
NEEDLE PERC 18GX7CM (NEEDLE) ×2 IMPLANT
PACK CARDIAC CATH (CUSTOM PROCEDURE TRAY) ×2 IMPLANT
SHEATH AVANTI 5FR X 11CM (SHEATH) ×2 IMPLANT
WIRE EMERALD 3MM-J .035X150CM (WIRE) ×2 IMPLANT

## 2016-10-11 ENCOUNTER — Encounter: Payer: Self-pay | Admitting: Internal Medicine

## 2016-12-02 ENCOUNTER — Encounter: Payer: Self-pay | Admitting: Emergency Medicine

## 2016-12-02 ENCOUNTER — Emergency Department
Admission: EM | Admit: 2016-12-02 | Discharge: 2016-12-02 | Disposition: A | Discharge status: Home / Self Care | Payer: BLUE CROSS/BLUE SHIELD | Attending: Emergency Medicine | Admitting: Emergency Medicine

## 2016-12-02 DIAGNOSIS — Z79899 Other long term (current) drug therapy: Secondary | ICD-10-CM | POA: Diagnosis not present

## 2016-12-02 DIAGNOSIS — Z87891 Personal history of nicotine dependence: Secondary | ICD-10-CM | POA: Insufficient documentation

## 2016-12-02 DIAGNOSIS — M25521 Pain in right elbow: Secondary | ICD-10-CM | POA: Diagnosis present

## 2016-12-02 DIAGNOSIS — M7711 Lateral epicondylitis, right elbow: Secondary | ICD-10-CM | POA: Insufficient documentation

## 2016-12-02 DIAGNOSIS — E119 Type 2 diabetes mellitus without complications: Secondary | ICD-10-CM | POA: Insufficient documentation

## 2016-12-02 DIAGNOSIS — Z794 Long term (current) use of insulin: Secondary | ICD-10-CM | POA: Diagnosis not present

## 2016-12-02 MED ORDER — MELOXICAM 7.5 MG PO TABS: 7.5000 mg | ORAL_TABLET | Freq: Every day | ORAL | 1 refills | Status: AC

## 2016-12-02 NOTE — ED Provider Notes (Signed)
[facility] Emergency Department Provider Note  ____________________________________________  Time seen: Approximately 8:21 PM  I have reviewed the triage vital signs and the nursing notes.   HISTORY  Chief Complaint Elbow Pain    HPI Miguel Walters is a 51 y.o. male presents to the emergency department with 10/10 right lateralforearm pain worsened active extension of the wrist or supination of the forearm. Patient has a history of lateral epicondylitis that is worsened with repetitive activities at work. Patient denies prior traumas or surgeries to the right upper extremity. He denies weakness or radiculopathy. He denies chest pain, chest tightness, shortness of breath, nausea, vomiting and abdominal pain. Patient is left-handed. No alleviating measures have been attempted.   Past Medical History:  Diagnosis Date  . Diabetes mellitus without complication (HCC)   . H. pylori infection   . Heart murmur   . Hyperlipemia   . Multiple gastric ulcers     There are no active problems to display for this patient.   Past Surgical History:  Procedure Laterality Date  . FRACTURE SURGERY    . LEFT HEART CATH AND CORONARY ANGIOGRAPHY Left 10/10/2016   Procedure: Left Heart Cath and Coronary Angiography;  Surgeon: Alwyn Pea, MD;  Location: [facility] INVASIVE CV LAB;  Service: Cardiovascular;  Laterality: Left;    Prior to Admission medications   Medication Sig Start Date End Date Taking? Authorizing Provider  atorvastatin (LIPITOR) 20 MG tablet Take 20 mg by mouth at bedtime.    Historical Provider, MD  insulin detemir (LEVEMIR) 100 UNIT/ML injection Inject 80 Units into the skin at bedtime.     Historical Provider, MD  lisinopril (PRINIVIL,ZESTRIL) 2.5 MG tablet Take 5 mg by mouth daily.    Historical Provider, MD  pantoprazole (PROTONIX) 40 MG tablet Take 40 mg by mouth 2 (two) times daily before a meal.  09/07/16   Historical Provider, MD   Pseudoeph-Doxylamine-DM-APAP (NYQUIL PO) Take 15 mLs by mouth at bedtime as needed (cold symptoms).    Historical Provider, MD  SitaGLIPtin-MetFORMIN HCl (JANUMET XR) 50-1000 MG TB24 Take 2 tablets by mouth daily with supper.    Historical Provider, MD  sucralfate (CARAFATE) 1 g tablet Take 1 tablet (1 g total) by mouth 4 (four) times daily. 07/16/16   Sharman Cheek, MD    Allergies Oxycodone and Hydrocodone  No family history on file.  Social History Social History  Substance Use Topics  . Smoking status: Former Smoker    Quit date: 02/23/2007  . Smokeless tobacco: Never Used  . Alcohol use No     Review of Systems  Constitutional: No fever/chills Eyes: No visual changes. No discharge ENT: No upper respiratory complaints. Cardiovascular: no chest pain. Respiratory: no cough. No SOB. Gastrointestinal: No abdominal pain.  No nausea, no vomiting.  No diarrhea.  No constipation. Genitourinary: Negative for dysuria. No hematuria Musculoskeletal: Patient has right elbow pain.  Skin: Negative for rash, abrasions, lacerations, ecchymosis. Neurological: Negative for headaches, focal weakness or numbness.  ____________________________________________   PHYSICAL EXAM:  VITAL SIGNS: ED Triage Vitals  Enc Vitals Group     BP 12/02/16 1928 135/81     Pulse Rate 12/02/16 1928 82     Resp 12/02/16 1928 17     Temp 12/02/16 1928 98.3 F (36.8 C)     Temp Source 12/02/16 1928 Oral     SpO2 12/02/16 1928 97 %     Weight 12/02/16 1926 200 lb (90.7 kg)     Height  12/02/16 1926  (1.702 m)     Head Circumference --      Peak Flow --      Pain Score 12/02/16 1926 10     Pain Loc --      Pain Edu? --      Excl. in GC? --     Constitutional: Alert and oriented. Well appearing and in no acute distress. Eyes: Conjunctivae are normal. PERRL. EOMI. Head: Atraumatic. ENT:      Ears:       Nose: No congestion/rhinnorhea.      Mouth/Throat: Mucous membranes are moist.   Cardiovascular: Normal rate, regular rhythm. Normal S1 and S2.  Good peripheral circulation. Respiratory: Normal respiratory effort without tachypnea or retractions. Lungs CTAB. Good air entry to the bases with no decreased or absent breath sounds. Musculoskeletal: Patient has 5 out of 5 strength in the upper extremities bilaterally. Patient had tenderness elicited with palpation of the right lateral epicondyle. Patient's symptoms are reproduced with resisted flexion at the right wrist. Palpable radial and ulnar pulses bilaterally and symmetrically. Neurologic:  Normal speech and language. No gross focal neurologic deficits are appreciated. Reflexes are 2+ and symmetric in the upper extremities bilaterally. Skin:  Skin is warm, dry and intact. No rash noted. Psychiatric: Mood and affect are normal. Speech and behavior are normal. Patient exhibits appropriate insight and judgement.  ____________________________________________   LABS (all labs ordered are listed, but only abnormal results are displayed)  Labs Reviewed - No data to display ____________________________________________  EKG   ____________________________________________  RADIOLOGY Geraldo Pitter, personally viewed and evaluated these images (plain radiographs) as part of my medical decision making, as well as reviewing the written report by the radiologist.  No results found.  ____________________________________________    PROCEDURES  Procedure(s) performed:    Procedures    Medications - No data to display   ____________________________________________   INITIAL IMPRESSION / ASSESSMENT AND PLAN / ED COURSE  Pertinent labs & imaging results that were available during my care of the patient were reviewed by me and considered in my medical decision making (see chart for details).  Review of the [place] CSRS was performed in accordance of the [facility] prior to dispensing any controlled drugs.     Assessment  and plan: Lateral epicondylitis Patient presents to the emergency department with pain along the distribution of the right lateral epicondyle. Patient has a history of lateral epicondylitis and often engages in repetitive tasks at work. On physical exam, patient has reproducible pain with resisted flexion at the right wrist, consistent with lateral epicondylitis. Patient education was given regarding the importance of using ice at the end of the workday. Patient was discharged with Mobic for pain and inflammation. A referral was given to orthopedics, Dr. Joice Lofts. Vital signs are reassuring at this time. All patient questions were answered.  ____________________________________________  FINAL CLINICAL IMPRESSION(S) / ED DIAGNOSES  Final diagnoses:  None      NEW MEDICATIONS STARTED DURING THIS VISIT:  New Prescriptions   No medications on file        This chart was dictated using voice recognition software/Dragon. Despite best efforts to proofread, errors can occur which can change the meaning. Any change was purely unintentional.    Orvil Feil, PA-C 12/02/16 2034    Arnaldo Natal, MD 12/04/16 1435

## 2016-12-02 NOTE — ED Triage Notes (Signed)
Pt comes into the ED via POV c/o right elbow pain that started yesterday.  Patient in NAD at this time and denies any radiating pain.  Patient has h/o of same problem and is concerned it may be tendonitis.  Patient able to move arm WNL.

## 2017-01-18 ENCOUNTER — Emergency Department
Admission: EM | Admit: 2017-01-18 | Discharge: 2017-01-18 | Disposition: A | Discharge status: Home / Self Care | Payer: BLUE CROSS/BLUE SHIELD | Attending: Emergency Medicine | Admitting: Emergency Medicine

## 2017-01-18 ENCOUNTER — Emergency Department: Payer: BLUE CROSS/BLUE SHIELD

## 2017-01-18 DIAGNOSIS — E119 Type 2 diabetes mellitus without complications: Secondary | ICD-10-CM | POA: Insufficient documentation

## 2017-01-18 DIAGNOSIS — R1013 Epigastric pain: Secondary | ICD-10-CM | POA: Insufficient documentation

## 2017-01-18 DIAGNOSIS — Z79899 Other long term (current) drug therapy: Secondary | ICD-10-CM | POA: Diagnosis not present

## 2017-01-18 DIAGNOSIS — Z87891 Personal history of nicotine dependence: Secondary | ICD-10-CM | POA: Diagnosis not present

## 2017-01-18 DIAGNOSIS — Z794 Long term (current) use of insulin: Secondary | ICD-10-CM | POA: Insufficient documentation

## 2017-01-18 LAB — CBC
HEMATOCRIT: 41.6 % (ref 40.0–52.0)
HEMOGLOBIN: 14.8 g/dL (ref 13.0–18.0)
MCH: 30.6 pg (ref 26.0–34.0)
MCHC: 35.6 g/dL (ref 32.0–36.0)
MCV: 85.9 fL (ref 80.0–100.0)
Platelets: 175 10*3/uL (ref 150–440)
RBC: 4.85 MIL/uL (ref 4.40–5.90)
RDW: 13 % (ref 11.5–14.5)
WBC: 9 10*3/uL (ref 3.8–10.6)

## 2017-01-18 LAB — TROPONIN I: Troponin I: <0.03 ng/mL (ref <0.03)

## 2017-01-18 LAB — BASIC METABOLIC PANEL
ANION GAP: 8 (ref 5–15)
BUN: 10 mg/dL (ref 6–20)
CHLORIDE: 102 mmol/L (ref 101–111)
CO2: 26 mmol/L (ref 22–32)
Calcium: 9.4 mg/dL (ref 8.9–10.3)
Creatinine, Ser: 0.81 mg/dL (ref 0.61–1.24)
GFR calc Af Amer: >60 mL/min (ref >60)
Glucose, Bld: 172 mg/dL — ABNORMAL HIGH (ref 65–99)
POTASSIUM: 3.6 mmol/L (ref 3.5–5.1)
SODIUM: 136 mmol/L (ref 135–145)

## 2017-01-18 MED ORDER — GI COCKTAIL ~~LOC~~
30.0000 mL | Freq: Once | ORAL | Status: AC
  Administered 2017-01-18: 30 mL via ORAL
  Filled 2017-01-18: qty 30

## 2017-01-18 MED ORDER — FAMOTIDINE 20 MG PO TABS
40.0000 mg | ORAL_TABLET | Freq: Once | ORAL | Status: AC
  Administered 2017-01-18: 40 mg via ORAL
  Filled 2017-01-18: qty 2

## 2017-01-18 NOTE — ED Provider Notes (Signed)
[facility] Emergency Department Provider Note  ____________________________________________   First MD Initiated Contact with Patient 01/18/17 1701     (approximate)  I have reviewed the triage vital signs and the nursing notes.   HISTORY  Chief Complaint Abdominal Pain    HPI Miguel Walters is a 51 y.o. male who self presents to the emergency department with burning searing epigastric pain rising in his chest to his mouth that happened this morning when he awoke. He said last night he ate a hamburger with onions on it and began to feel epigastric discomfort. His pain is been constant ever since however is worsened every time he tries to drink or eat. He tried taking Tums but it did not make it better so he came to the emergency department today. His pain is mild to moderate in severity.  3 months ago the patient had a cardiac catheterization with the following results:  Conclusion    The left ventricular systolic function is normal.  LV end diastolic pressure is normal.  The left ventricular ejection fraction is 55-65% by visual estimate.  Left dominant system with no significant obstructive coronary disease       Past Medical History:  Diagnosis Date  . Diabetes mellitus without complication (HCC)   . H. pylori infection   . Heart murmur   . Hyperlipemia   . Multiple gastric ulcers     There are no active problems to display for this patient.   Past Surgical History:  Procedure Laterality Date  . FRACTURE SURGERY    . LEFT HEART CATH AND CORONARY ANGIOGRAPHY Left 10/10/2016   Procedure: Left Heart Cath and Coronary Angiography;  Surgeon: Alwyn Pea, MD;  Location: [facility] INVASIVE CV LAB;  Service: Cardiovascular;  Laterality: Left;    Prior to Admission medications   Medication Sig Start Date End Date Taking? Authorizing Provider  atorvastatin (LIPITOR) 20 MG tablet Take 20 mg by mouth at bedtime.    [provider]    insulin detemir (LEVEMIR) 100 UNIT/ML injection Inject 80 Units into the skin at bedtime.     [provider]  lisinopril (PRINIVIL,ZESTRIL) 2.5 MG tablet Take 5 mg by mouth daily.    [provider]  pantoprazole (PROTONIX) 40 MG tablet Take 40 mg by mouth 2 (two) times daily before a meal.  09/07/16   [provider]  Pseudoeph-Doxylamine-DM-APAP (NYQUIL PO) Take 15 mLs by mouth at bedtime as needed (cold symptoms).    [provider]  SitaGLIPtin-MetFORMIN HCl (JANUMET XR) 50-1000 MG TB24 Take 2 tablets by mouth daily with supper.    [provider]  sucralfate (CARAFATE) 1 g tablet Take 1 tablet (1 g total) by mouth 4 (four) times daily. 07/16/16   Sharman Cheek, MD    Allergies Codeine; Oxycodone; and Hydrocodone  No family history on file.  Social History Social History  Substance Use Topics  . Smoking status: Former Smoker    Quit date: 02/23/2007  . Smokeless tobacco: Never Used  . Alcohol use No    Review of Systems Constitutional: No fever/chills Eyes: No visual changes. ENT: No sore throat. Cardiovascular: Denies chest pain. Respiratory: Denies shortness of breath. Gastrointestinal: Positive abdominal pain.  Positive nausea, no vomiting.  No diarrhea.  No constipation. Genitourinary: Negative for dysuria. Musculoskeletal: Negative for back pain. Skin: Negative for rash. Neurological: Negative for headaches, focal weakness or numbness.   ____________________________________________   PHYSICAL EXAM:  VITAL SIGNS: ED Triage Vitals  Enc Vitals Group     BP 01/18/17 1528 121/84     Pulse Rate 01/18/17 1528 63     Resp 01/18/17 1528 16     Temp 01/18/17 1528 97.9 F (36.6 C)     Temp Source 01/18/17 1528 Oral     SpO2 01/18/17 1528 100 %     Weight 01/18/17 1529 194 lb (88 kg)     Height 01/18/17 1529 5\' 7"  (1.702 m)     Head Circumference --      Peak Flow --      Pain Score 01/18/17 1528 10     Pain Loc --       Pain Edu? --      Excl. in GC? --     Constitutional: Alert and oriented x 4 well appearing nontoxic no diaphoresis speaks in full, clear sentences Eyes: PERRL EOMI. Head: Atraumatic. Nose: No congestion/rhinnorhea. Mouth/Throat: No trismus Neck: No stridor.   Cardiovascular: Normal rate, regular rhythm. Grossly normal heart sounds.  Good peripheral circulation. Respiratory: Normal respiratory effort.  No retractions. Lungs CTAB and moving good air Gastrointestinal: Soft nondistended nontender no rebound or guarding no peritonitis no McBurney's tenderness negative Rovsing's no costovertebral tenderness negative Murphy's Musculoskeletal: No lower extremity edema   Neurologic:  Normal speech and language. No gross focal neurologic deficits are appreciated. Skin:  Skin is warm, dry and intact. No rash noted. Psychiatric: Mood and affect are normal. Speech and behavior are normal.    ____________________________________________   DIFFERENTIAL  Gastric reflux, esophageal spasm, acute coronary syndrome, angina, biliary colic, cholecystitis ____________________________________________   LABS (all labs ordered are listed, but only abnormal results are displayed)  Labs Reviewed  BASIC METABOLIC PANEL - Abnormal; Notable for the following:       Result Value   Glucose, Bld 172 (*)    All other components within normal limits  CBC  TROPONIN I    No signs of acute ischemia __________________________________________  EKG  ED ECG REPORT I, Merrily BrittleNeil Arelie Kuzel, the attending physician, personally viewed and interpreted this ECG.  Date: 01/18/2017 Rate: 55 Rhythm: Sinus bradycardia QRS Axis: normal Intervals: normal ST/T Wave abnormalities: normal Conduction Disturbances: none Narrative Interpretation: unremarkable  ____________________________________________  RADIOLOGY  Chest x-ray with no acute  disease ____________________________________________   PROCEDURES  Procedure(s) performed: no  Procedures  Critical Care performed: no  Observation: no ____________________________________________   INITIAL IMPRESSION / ASSESSMENT AND PLAN / ED COURSE  Pertinent labs & imaging results that were available during my care of the patient were reviewed by me and considered in my medical decision making (see chart for details).  On arrival the patient is sitting nearly sitting up reporting epigastric pain radiating up to his mouth. This sounds like gastric reflux but could also represent biliary colic as well. His first troponin is negative and he had a recent negative cardiac catheterization so doubt ACS. I will give a trial of H2 blocker and a GI cocktail and reevaluate.     ----------------------------------------- 6:02 PM on 01/18/2017 -----------------------------------------  After a GI cocktail and Pepcid the patient does not feel any better. Given his postprandial pain I told him that I was concerned about his gallbladder and I would like to order an ultrasound however he declined stating he just wants to go home. I recommend a one day abdominal recheck strict return precautions. ____________________________________________   FINAL CLINICAL IMPRESSION(S) / ED DIAGNOSES  Final diagnoses:  Epigastric pain      NEW MEDICATIONS STARTED  DURING THIS VISIT:  Discharge Medication List as of 01/18/2017  6:02 PM       Note:  This document was prepared using Dragon voice recognition software and may include unintentional dictation errors.     Merrily Brittle, MD 01/18/17 1932

## 2017-01-18 NOTE — ED Triage Notes (Signed)
Pt arrives to ER via POV c/o epigastric pain X 2 days. Pt alert and oriented X4, active, cooperative, pt in NAD. RR even and unlabored, color WNL.

## 2017-01-18 NOTE — Discharge Instructions (Signed)
Please follow-up with your primary care physician tomorrow for recheck of your stomach. If you are unable to see her primary care physician please come back to our department. Today I recommended that you get an ultrasound of your stomach to see if you have issues with your gallbladder.  If you change your mind, please come back and we are more than happy to continue evaluating him. Please return to the emergency department sooner for any worsening symptoms such as worsening pain, if you cannot eat or drink, for fevers, or for any other concerns.  It was a pleasure to take care of you today, and thank you for coming to our emergency department.  If you have any questions or concerns before leaving please ask the nurse to grab me and I'm more than happy to go through your aftercare instructions again.  If you were prescribed any opioid pain medication today such as Norco, Vicodin, Percocet, morphine, hydrocodone, or oxycodone please make sure you do not drive when you are taking this medication as it can alter your ability to drive safely.  If you have any concerns once you are home that you are not improving or are in fact getting worse before you can make it to your follow-up appointment, please do not hesitate to call 911 and come back for further evaluation.  Merrily BrittleNeil Nardos Putnam MD  Results for orders placed or performed during the hospital encounter of 01/18/17  Basic metabolic panel  Result Value Ref Range   Sodium 136 135 - 145 mmol/L   Potassium 3.6 3.5 - 5.1 mmol/L   Chloride 102 101 - 111 mmol/L   CO2 26 22 - 32 mmol/L   Glucose, Bld 172 (H) 65 - 99 mg/dL   BUN 10 6 - 20 mg/dL   Creatinine, Ser 5.780.81 0.61 - 1.24 mg/dL   Calcium 9.4 8.9 - 46.910.3 mg/dL   GFR calc non Af Amer >60 >60 mL/min   GFR calc Af Amer >60 >60 mL/min   Anion gap 8 5 - 15  CBC  Result Value Ref Range   WBC 9.0 3.8 - 10.6 K/uL   RBC 4.85 4.40 - 5.90 MIL/uL   Hemoglobin 14.8 13.0 - 18.0 g/dL   HCT 62.941.6 52.840.0 - 41.352.0 %   MCV 85.9 80.0 - 100.0 fL   MCH 30.6 26.0 - 34.0 pg   MCHC 35.6 32.0 - 36.0 g/dL   RDW 24.413.0 01.011.5 - 27.214.5 %   Platelets 175 150 - 440 K/uL  Troponin I  Result Value Ref Range   Troponin I <0.03 <0.03 ng/mL   Dg Chest 2 View  Result Date: 01/18/2017 CLINICAL DATA:  Chest pressure and shortness of Breath EXAM: CHEST  2 VIEW COMPARISON:  07/23/2016 FINDINGS: Cardiac shadow is within normal limits. Mild pectus deformity is again noted. Degenerative changes of thoracic spine are seen. No focal infiltrate or sizable effusion is noted. IMPRESSION: No acute abnormality noted. Electronically Signed   By: Alcide CleverMark  Lukens M.D.   On: 01/18/2017 16:11

## 2017-06-17 ENCOUNTER — Emergency Department
Admission: EM | Admit: 2017-06-17 | Discharge: 2017-06-17 | Disposition: A | Discharge status: Home / Self Care | Payer: BLUE CROSS/BLUE SHIELD | Attending: Emergency Medicine | Admitting: Emergency Medicine

## 2017-06-17 ENCOUNTER — Emergency Department: Payer: BLUE CROSS/BLUE SHIELD

## 2017-06-17 ENCOUNTER — Encounter: Payer: Self-pay | Admitting: Emergency Medicine

## 2017-06-17 DIAGNOSIS — Z794 Long term (current) use of insulin: Secondary | ICD-10-CM | POA: Insufficient documentation

## 2017-06-17 DIAGNOSIS — R0789 Other chest pain: Secondary | ICD-10-CM

## 2017-06-17 DIAGNOSIS — Z79899 Other long term (current) drug therapy: Secondary | ICD-10-CM | POA: Insufficient documentation

## 2017-06-17 DIAGNOSIS — E119 Type 2 diabetes mellitus without complications: Secondary | ICD-10-CM | POA: Insufficient documentation

## 2017-06-17 DIAGNOSIS — Z87891 Personal history of nicotine dependence: Secondary | ICD-10-CM | POA: Insufficient documentation

## 2017-06-17 DIAGNOSIS — R1013 Epigastric pain: Secondary | ICD-10-CM | POA: Insufficient documentation

## 2017-06-17 LAB — CBC
HEMATOCRIT: 48.4 % (ref 40.0–52.0)
HEMOGLOBIN: 16.2 g/dL (ref 13.0–18.0)
MCH: 29.6 pg (ref 26.0–34.0)
MCHC: 33.6 g/dL (ref 32.0–36.0)
MCV: 88.3 fL (ref 80.0–100.0)
Platelets: 206 10*3/uL (ref 150–440)
RBC: 5.48 MIL/uL (ref 4.40–5.90)
RDW: 13.8 % (ref 11.5–14.5)
WBC: 7.4 10*3/uL (ref 3.8–10.6)

## 2017-06-17 LAB — BASIC METABOLIC PANEL
ANION GAP: 7 (ref 5–15)
BUN: 13 mg/dL (ref 6–20)
CHLORIDE: 107 mmol/L (ref 101–111)
CO2: 22 mmol/L (ref 22–32)
Calcium: 9.1 mg/dL (ref 8.9–10.3)
Creatinine, Ser: 1.05 mg/dL (ref 0.61–1.24)
GFR calc Af Amer: >60 mL/min (ref >60)
Glucose, Bld: 228 mg/dL — ABNORMAL HIGH (ref 65–99)
POTASSIUM: 3.6 mmol/L (ref 3.5–5.1)
SODIUM: 136 mmol/L (ref 135–145)

## 2017-06-17 LAB — LIPASE, BLOOD: LIPASE: 28 U/L (ref 11–51)

## 2017-06-17 LAB — HEPATIC FUNCTION PANEL
ALBUMIN: 4.3 g/dL (ref 3.5–5.0)
ALT: 24 U/L (ref 17–63)
AST: 28 U/L (ref 15–41)
Alkaline Phosphatase: 87 U/L (ref 38–126)
BILIRUBIN INDIRECT: 0.6 mg/dL (ref 0.3–0.9)
Bilirubin, Direct: 0.1 mg/dL (ref 0.1–0.5)
TOTAL PROTEIN: 7.6 g/dL (ref 6.5–8.1)
Total Bilirubin: 0.7 mg/dL (ref 0.3–1.2)

## 2017-06-17 LAB — TROPONIN I: Troponin I: <0.03 ng/mL (ref <0.03)

## 2017-06-17 MED ORDER — GI COCKTAIL ~~LOC~~
30.0000 mL | Freq: Once | ORAL | Status: AC
  Administered 2017-06-17: 30 mL via ORAL
  Filled 2017-06-17: qty 30

## 2017-06-17 NOTE — ED Provider Notes (Signed)
[facility] Emergency Department Provider Note  ____________________________________________   I have reviewed the triage vital signs and the nursing notes.   HISTORY  Chief Complaint Chest Pain    HPI Miguel Walters is a 51 y.o. male with a history of chronic recurrent right-sided chest pain, has had extensive workup for this in the past including a negative heart cath this year.  Patient states that he has had endoscopies and colonoscopies for this pain.  He has epigastric pain which radiates in a burning sensation up to his throat and back.  His family thinks his flex is acting up.  Patient states that he has a slight headache which is not the worst headache of life gradual in onset and he would not be here for the headache.  It is unrelated in time to the abdominal pain.  He did have breakfast this morning, symptoms started immediately after that and persisted all day.  There is no other associated symptoms, mild to moderate in severity, no prior treatment.  States he does have  a history of ulcers he has had no recent melena bright red blood per rectum or hematemesis.     Past Medical History:  Diagnosis Date  . Diabetes mellitus without complication (HCC)   . H. pylori infection   . Heart murmur   . Hyperlipemia   . Multiple gastric ulcers     There are no active problems to display for this patient.   Past Surgical History:  Procedure Laterality Date  . FRACTURE SURGERY    . LEFT HEART CATH AND CORONARY ANGIOGRAPHY Left 10/10/2016   Procedure: Left Heart Cath and Coronary Angiography;  Surgeon: Alwyn Pea, MD;  Location: [facility] INVASIVE CV LAB;  Service: Cardiovascular;  Laterality: Left;    Prior to Admission medications   Medication Sig Start Date End Date Taking? Authorizing Provider  atorvastatin (LIPITOR) 20 MG tablet Take 20 mg by mouth at bedtime.    [provider]  insulin detemir (LEVEMIR) 100 UNIT/ML injection Inject 80  Units into the skin at bedtime.     [provider]  lisinopril (PRINIVIL,ZESTRIL) 2.5 MG tablet Take 5 mg by mouth daily.    [provider]  pantoprazole (PROTONIX) 40 MG tablet Take 40 mg by mouth 2 (two) times daily before a meal.  09/07/16   [provider]  Pseudoeph-Doxylamine-DM-APAP (NYQUIL PO) Take 15 mLs by mouth at bedtime as needed (cold symptoms).    [provider]  SitaGLIPtin-MetFORMIN HCl (JANUMET XR) 50-1000 MG TB24 Take 2 tablets by mouth daily with supper.    [provider]  sucralfate (CARAFATE) 1 g tablet Take 1 tablet (1 g total) by mouth 4 (four) times daily. 07/16/16   Sharman Cheek, MD    Allergies Codeine; Oxycodone; and Hydrocodone  History reviewed. No pertinent family history.  Social History Social History  Substance Use Topics  . Smoking status: Former Smoker    Quit date: 02/23/2007  . Smokeless tobacco: Never Used  . Alcohol use No    Review of Systems Constitutional: No fever/chills Eyes: No visual changes. ENT: No sore throat. No stiff neck no neck pain Cardiovascular: Planes of chest pain but pointed to the epigastric region Respiratory: Denies shortness of breath. Gastrointestinal:   no vomiting.  No diarrhea.  No constipation. Genitourinary: Negative for dysuria. Musculoskeletal: Negative lower extremity swelling Skin: Negative for rash. Neurological: Negative for severe headaches, focal weakness or numbness.   ____________________________________________   PHYSICAL EXAM:  VITAL SIGNS: ED Triage Vitals  Enc Vitals Group     BP 06/17/17 1634 135/81     Pulse Rate 06/17/17 1634 71     Resp 06/17/17 1634 16     Temp 06/17/17 1634 98.2 F (36.8 C)     Temp Source 06/17/17 1634 Oral     SpO2 06/17/17 1634 97 %     Weight 06/17/17 1629 189 lb (85.7 kg)     Height 06/17/17 1629 5\' 7"  (1.702 m)     Head Circumference --      Peak Flow --      Pain Score 06/17/17 1629 7     Pain Loc --       Pain Edu? --      Excl. in GC? --     Constitutional: Alert and oriented. Well appearing and in no acute distress. Eyes: Conjunctivae are normal Head: Atraumatic HEENT: No congestion/rhinnorhea. Mucous membranes are moist.  Oropharynx non-erythematous Neck:   Nontender with no meningismus, no masses, no stridor Cardiovascular: Normal rate, regular rhythm. Grossly normal heart sounds.  Good peripheral circulation. Respiratory: Normal respiratory effort.  No retractions. Lungs CTAB. Abdominal: Soft and positive epigastric discomfort reproduces his pain. No distention. No guarding no rebound Back:  There is no focal tenderness or step off.  there is no midline tenderness there are no lesions noted. there is no CVA tenderness Musculoskeletal: No lower extremity tenderness, no upper extremity tenderness. No joint effusions, no DVT signs strong distal pulses no edema Neurologic:  Normal speech and language. No gross focal neurologic deficits are appreciated.  Skin:  Skin is warm, dry and intact. No rash noted. Psychiatric: Mood and affect are normal. Speech and behavior are normal.  ____________________________________________   LABS (all labs ordered are listed, but only abnormal results are displayed)  Labs Reviewed  BASIC METABOLIC PANEL - Abnormal; Notable for the following:       Result Value   Glucose, Bld 228 (*)    All other components within normal limits  CBC  TROPONIN I  LIPASE, BLOOD  HEPATIC FUNCTION PANEL    Pertinent labs  results that were available during my care of the patient were reviewed by me and considered in my medical decision making (see chart for details). ____________________________________________  EKG  I personally interpreted any EKGs ordered by me or triage Sinus rhythm rate 71 bpm no acute ST elevation or depression, borderline LAD no acute ischemic changes.  _______________________  RADIOLOGY  Pertinent labs & imaging results that were  available during my care of the patient were reviewed by me and considered in my medical decision making (see chart for details). If possible, patient and/or family made aware of any abnormal findings. ____________________________________________    PROCEDURES  Procedure(s) performed: None  Procedures  Critical Care performed: None  ____________________________________________   INITIAL IMPRESSION / ASSESSMENT AND PLAN / ED COURSE  Pertinent labs & imaging results that were available during my care of the patient were reviewed by me and considered in my medical decision making (see chart for details).  Patient is here with r reducible epigastric abdominal discomfort which has been present for the entire day and off and on for years.  Has had extensive workup including colonoscopies and cardiac catheterization.  Despite symptoms uninterruptedly all day, patient has negative troponin which is reassuring as is the rest of his exam as is the rest of his history, at this time, there does not appear to be clinical evidence to  support the diagnosis of pulmonary embolus, dissection, myocarditis, endocarditis, pericarditis, pericardial tamponade, acute coronary syndrome, pneumothorax, pneumonia, or any other acute intrathoracic pathology that will require admission or acute intervention. Nor is there evidence of any significant intra-abdominal pathology causing this discomfort.  Considering the patient's symptoms, medical history, and physical examination today, I have low suspicion for cholecystitis or biliary pathology, pancreatitis, perforation or bowel obstruction, hernia, intra-abdominal abscess, AAA or dissection, volvulus or intussusception, mesenteric ischemia, ischemic gut, pyelonephritis or appendicitis.  We will give him a GI cocktail check liver function tests, pancreatic enzymes and reassess    ____________________________________________   FINAL CLINICAL IMPRESSION(S) / ED  DIAGNOSES  Final diagnoses:  None      This chart was dictated using voice recognition software.  Despite best efforts to proofread,  errors can occur which can change meaning.      Jeanmarie PlantMcShane, Maico Mulvehill A, MD 06/17/17 [phone]

## 2017-06-17 NOTE — ED Triage Notes (Signed)
Patient presents to [facility] ED from home POV with Left sided chest pain, non radiating with a headache. Denies shob, nausea, vomiting, diaphoresis. Pt had clean angiography in February

## 2017-07-11 ENCOUNTER — Other Ambulatory Visit: Payer: Self-pay

## 2017-07-11 ENCOUNTER — Emergency Department
Admission: EM | Admit: 2017-07-11 | Discharge: 2017-07-11 | Disposition: A | Discharge status: Home / Self Care | Payer: BLUE CROSS/BLUE SHIELD | Attending: Emergency Medicine | Admitting: Emergency Medicine

## 2017-07-11 DIAGNOSIS — R51 Headache: Secondary | ICD-10-CM | POA: Insufficient documentation

## 2017-07-11 DIAGNOSIS — Z79899 Other long term (current) drug therapy: Secondary | ICD-10-CM | POA: Insufficient documentation

## 2017-07-11 DIAGNOSIS — T5811XA Toxic effect of carbon monoxide from utility gas, accidental (unintentional), initial encounter: Secondary | ICD-10-CM | POA: Diagnosis not present

## 2017-07-11 DIAGNOSIS — E119 Type 2 diabetes mellitus without complications: Secondary | ICD-10-CM | POA: Diagnosis not present

## 2017-07-11 DIAGNOSIS — Z794 Long term (current) use of insulin: Secondary | ICD-10-CM | POA: Diagnosis not present

## 2017-07-11 DIAGNOSIS — Z7729 Contact with and (suspected ) exposure to other hazardous substances: Secondary | ICD-10-CM

## 2017-07-11 DIAGNOSIS — R519 Headache, unspecified: Secondary | ICD-10-CM

## 2017-07-11 DIAGNOSIS — T5891XA Toxic effect of carbon monoxide from unspecified source, accidental (unintentional), initial encounter: Secondary | ICD-10-CM | POA: Insufficient documentation

## 2017-07-11 DIAGNOSIS — R110 Nausea: Secondary | ICD-10-CM | POA: Diagnosis not present

## 2017-07-11 DIAGNOSIS — R400 Somnolence: Secondary | ICD-10-CM | POA: Diagnosis present

## 2017-07-11 DIAGNOSIS — R5383 Other fatigue: Secondary | ICD-10-CM | POA: Diagnosis not present

## 2017-07-11 LAB — COOXEMETRY PANEL
Methemoglobin: >1.6 % — ABNORMAL HIGH (ref 0.0–1.5)
O2 Saturation: 45.5 %
Total hemoglobin: 15.9 g/dL (ref 12.0–16.0)

## 2017-07-11 LAB — CBC WITH DIFFERENTIAL/PLATELET
Basophils Absolute: 0.1 10*3/uL (ref 0–0.1)
Basophils Relative: 1 %
Eosinophils Absolute: 0.1 10*3/uL (ref 0–0.7)
Eosinophils Relative: 1 %
HCT: 45 % (ref 40.0–52.0)
Hemoglobin: 15 g/dL (ref 13.0–18.0)
Lymphocytes Relative: 18 %
Lymphs Abs: 1.4 10*3/uL (ref 1.0–3.6)
MCH: 29.5 pg (ref 26.0–34.0)
MCHC: 33.3 g/dL (ref 32.0–36.0)
MCV: 88.6 fL (ref 80.0–100.0)
Monocytes Absolute: 0.5 10*3/uL (ref 0.2–1.0)
Monocytes Relative: 6 %
Neutro Abs: 5.5 10*3/uL (ref 1.4–6.5)
Neutrophils Relative %: 74 %
Platelets: 178 10*3/uL (ref 150–440)
RBC: 5.09 MIL/uL (ref 4.40–5.90)
RDW: 14.1 % (ref 11.5–14.5)
WBC: 7.5 10*3/uL (ref 3.8–10.6)

## 2017-07-11 LAB — BASIC METABOLIC PANEL
Anion gap: 9 (ref 5–15)
BUN: 12 mg/dL (ref 6–20)
CO2: 25 mmol/L (ref 22–32)
Calcium: 9 mg/dL (ref 8.9–10.3)
Chloride: 103 mmol/L (ref 101–111)
Creatinine, Ser: 0.92 mg/dL (ref 0.61–1.24)
GFR calc Af Amer: >60 mL/min (ref >60)
GFR calc non Af Amer: >60 mL/min (ref >60)
Glucose, Bld: 88 mg/dL (ref 65–99)
Potassium: 3.9 mmol/L (ref 3.5–5.1)
Sodium: 137 mmol/L (ref 135–145)

## 2017-07-11 LAB — TROPONIN I: Troponin I: <0.03 ng/mL (ref <0.03)

## 2017-07-11 NOTE — ED Provider Notes (Signed)
[facility] Emergency Department Provider Note  ____________________________________________  Time seen: Approximately 1:58 PM  I have reviewed the triage vital signs and the nursing notes.   HISTORY  Chief Complaint Toxic Inhalation   HPI Miguel Walters is a 51 y.o. male with a history of diabetes, hyperlipidemia, peptic ulcer disease who presents for evaluation of possible carbon monoxide exposure. Patient reports that since moving to his current home 2 years ago him and his wife have been constantly sick. His symptoms are fatigue, somnolence, daily headaches, and nausea. His house was inspected a year ago and found to have a CO leak which was repaired. Yesterday his house was evaluated again and found to have a leak of CO. This has been fixed. Today patient woke up feeling very sleepy, developed nausea, and a mild throbbing HAwhich he has daily and went to work where he was then told by his boss to come to the ED for evaluation. no chest pain or shortness of breath, no dizziness, no slurred speech, no facial droop, no unilateral weakness or numbness, no vomiting or diarrhea, no fever or chills.  Past Medical History:  Diagnosis Date  . Diabetes mellitus without complication (HCC)   . H. pylori infection   . Heart murmur   . Hyperlipemia   . Multiple gastric ulcers     There are no active problems to display for this patient.   Past Surgical History:  Procedure Laterality Date  . FRACTURE SURGERY    . LEFT HEART CATH AND CORONARY ANGIOGRAPHY Left 10/10/2016   Procedure: Left Heart Cath and Coronary Angiography;  Surgeon: Alwyn Peawayne D Callwood, MD;  Location: [facility] INVASIVE CV LAB;  Service: Cardiovascular;  Laterality: Left;    Prior to Admission medications   Medication Sig Start Date End Date Taking? Authorizing Provider  insulin detemir (LEVEMIR) 100 UNIT/ML injection Inject 80 Units into the skin 2 (two) times daily.    Yes [provider]    JARDIANCE 10 MG TABS tablet Take 10 mg by mouth daily. 06/04/17  Yes [provider]  naproxen (NAPROSYN) 500 MG tablet Take 1 tablet by mouth 2 (two) times daily. 07/03/17  Yes [provider]  omeprazole (PRILOSEC) 20 MG capsule Take 20 mg by mouth daily.   Yes [provider]  SitaGLIPtin-MetFORMIN HCl (JANUMET XR) 50-1000 MG TB24 Take 2 tablets by mouth daily with supper.   Yes [provider]  sucralfate (CARAFATE) 1 g tablet Take 1 tablet (1 g total) by mouth 4 (four) times daily. Patient not taking: Reported on 06/17/2017 07/16/16   Sharman CheekStafford, Phillip, MD    Allergies Codeine; Oxycodone; and Hydrocodone  No family history on file.  Social History Social History   Tobacco Use  . Smoking status: Former Smoker    Last attempt to quit: 02/23/2007    Years since quitting: 10.3  . Smokeless tobacco: Never Used  Substance Use Topics  . Alcohol use: No  . Drug use: No    Review of Systems  Constitutional: Negative for fever. + somnolence Eyes: Negative for visual changes. ENT: Negative for sore throat. Neck: No neck pain  Cardiovascular: Negative for chest pain. Respiratory: Negative for shortness of breath. Gastrointestinal: Negative for abdominal pain, vomiting or diarrhea. + nausea Genitourinary: Negative for dysuria. Musculoskeletal: Negative for back pain. Skin: Negative for rash. Neurological: Negative for weakness or numbness.  + headache Psych: No SI or HI  ____________________________________________   PHYSICAL EXAM:  VITAL SIGNS: ED Triage Vitals  Enc Vitals Group     BP 07/11/17 1037 133/79     Pulse Rate 07/11/17 1037 62     Resp 07/11/17 1037 16     Temp 07/11/17 1037 97.7 F (36.5 C)     Temp Source 07/11/17 1037 Oral     SpO2 07/11/17 1037 97 %     Weight 07/11/17 1038 185 lb (83.9 kg)     Height 07/11/17 1038 5\' 7"  (1.702 m)     Head Circumference --      Peak Flow --      Pain Score 07/11/17 1037 8     Pain  Loc --      Pain Edu? --      Excl. in GC? --     Constitutional: Alert and oriented. Well appearing and in no apparent distress. HEENT:      Head: Normocephalic and atraumatic.         Eyes: Conjunctivae are normal. Sclera is non-icteric.       Mouth/Throat: Mucous membranes are moist.       Neck: Supple with no signs of meningismus. Cardiovascular: Regular rate and rhythm. No murmurs, gallops, or rubs. 2+ symmetrical distal pulses are present in all extremities. No JVD. Respiratory: Normal respiratory effort. Lungs are clear to auscultation bilaterally. No wheezes, crackles, or rhonchi.  Gastrointestinal: Soft, non tender, and non distended with positive bowel sounds. No rebound or guarding. Musculoskeletal: Nontender with normal range of motion in all extremities. No edema, cyanosis, or erythema of extremities. Neurologic: Normal speech and language. A & O x3, PERRL, EOMI, no nystagmus, CN II-XII intact, motor testing reveals good tone and bulk throughout. There is no evidence of pronator drift or dysmetria. Muscle strength is 5/5 throughout. Sensory examination is intact. Gait is normal. Skin: Skin is warm, dry and intact. No rash noted. Psychiatric: Mood and affect are normal. Speech and behavior are normal.  ____________________________________________   LABS (all labs ordered are listed, but only abnormal results are displayed)  Labs Reviewed  COOXEMETRY PANEL - Abnormal; Notable for the following components:      Result Value   Carboxyhemoglobin <2.2 (*)    Methemoglobin >1.6 (*)    All other components within normal limits  CBC WITH DIFFERENTIAL/PLATELET  BASIC METABOLIC PANEL  TROPONIN I   ____________________________________________  EKG  ED ECG REPORT I, Nita Sickle, the attending physician, personally viewed and interpreted this ECG.  Normal sinus rhythm, rate of 63, normal intervals, normal axis, occasional PVCs, no ST elevations or depressions.unchanged  from prior from November 2018  ____________________________________________  RADIOLOGY  none  ____________________________________________   PROCEDURES  Procedure(s) performed: None Procedures Critical Care performed:  None ____________________________________________   INITIAL IMPRESSION / ASSESSMENT AND PLAN / ED COURSE   51 y.o. male with a history of diabetes, hyperlipidemia, peptic ulcer disease who presents for evaluation of possible carbon monoxide exposure. carboxyhemoglobin and methemoglobinemia were slightly elevated 2.2 and 1.6 respectively. Patient neuro intact with no evidence of stroke, EKG with no evidence of ischemia. Other labs WNL. Patient was placed on 100% oxygen for several hours in the ED with resolution of his symptoms. had a very serious discussion with the patient about the risks of being exposed to carbon monoxide which include significant morbidity and mortality. It seems the problem has been addressed and fixed at his house therefore I think it safe for patient to be discharged at this time. Recommend that he send his wife to the emergency department to be  tested as well. There are no children or pregnant women in the household. Discussed signs and symptoms of carbon monoxide poisoning and recommended the patient returns if these develop.      As part of my medical decision making, I reviewed the following data within the electronic MEDICAL RECORD NUMBER Nursing notes reviewed and incorporated, Labs reviewed , EKG interpreted , Old EKG reviewed, Notes from prior ED visits and [place] Controlled Substance Database    Pertinent labs & imaging results that were available during my care of the patient were reviewed by me and considered in my medical decision making (see chart for details).    ____________________________________________   FINAL CLINICAL IMPRESSION(S) / ED DIAGNOSES  Final diagnoses:  Somnolence  Fatigue, unspecified type  Acute nonintractable  headache, unspecified headache type  Carbon monoxide exposure      NEW MEDICATIONS STARTED DURING THIS VISIT:  ED Discharge Orders    None       Note:  This document was prepared using Dragon voice recognition software and may include unintentional dictation errors.    Nita SickleVeronese, Mason, MD 07/11/17 [phone]

## 2017-07-11 NOTE — ED Notes (Signed)
Pt verbalizes d/c teaching and follow up. p tin NAD at time of d/c, VS stable, pt ambulatory

## 2017-07-11 NOTE — ED Notes (Signed)
Patient placed on non-rebreather.

## 2017-07-11 NOTE — ED Notes (Signed)
Respiratory called for collection of sample which was put on ice per respiratory.

## 2017-07-11 NOTE — ED Triage Notes (Signed)
Pt c/o HA with nausea and fatigue, states yesterday someone checked his home for CO2 and states there is a leak, states he rents the property and last year the house was tagged for same issue but it was repaired and this time is from another leak..Marland Kitchen

## 2017-08-30 ENCOUNTER — Other Ambulatory Visit: Payer: Self-pay | Admitting: Student

## 2017-08-30 DIAGNOSIS — R1033 Periumbilical pain: Secondary | ICD-10-CM

## 2017-08-30 DIAGNOSIS — R6881 Early satiety: Secondary | ICD-10-CM

## 2017-08-30 DIAGNOSIS — R630 Anorexia: Secondary | ICD-10-CM

## 2017-08-30 DIAGNOSIS — R1013 Epigastric pain: Secondary | ICD-10-CM

## 2017-09-01 ENCOUNTER — Other Ambulatory Visit
Admission: RE | Admit: 2017-09-01 | Discharge: 2017-09-01 | Disposition: A | Discharge status: Home / Self Care | Payer: BLUE CROSS/BLUE SHIELD | Source: Ambulatory Visit | Attending: Student | Admitting: Student

## 2017-09-01 DIAGNOSIS — R197 Diarrhea, unspecified: Secondary | ICD-10-CM | POA: Diagnosis not present

## 2017-09-01 LAB — GASTROINTESTINAL PANEL BY PCR, STOOL (REPLACES STOOL CULTURE)

## 2017-09-01 LAB — C DIFFICILE QUICK SCREEN W PCR REFLEX
C DIFFICILE (CDIFF) TOXIN: NEGATIVE
C DIFFICLE (CDIFF) ANTIGEN: NEGATIVE
C Diff interpretation: NOT DETECTED

## 2017-09-03 LAB — H. PYLORI ANTIGEN, STOOL: H. PYLORI STOOL AG, EIA: NEGATIVE

## 2017-09-04 LAB — PANCREATIC ELASTASE, FECAL

## 2017-09-09 ENCOUNTER — Ambulatory Visit
Admission: RE | Admit: 2017-09-09 | Discharge: 2017-09-09 | Disposition: A | Discharge status: Home / Self Care | Payer: BLUE CROSS/BLUE SHIELD | Source: Ambulatory Visit | Attending: Student | Admitting: Student

## 2017-09-09 DIAGNOSIS — R6881 Early satiety: Secondary | ICD-10-CM | POA: Diagnosis not present

## 2017-09-09 DIAGNOSIS — R630 Anorexia: Secondary | ICD-10-CM | POA: Insufficient documentation

## 2017-09-09 DIAGNOSIS — R1033 Periumbilical pain: Secondary | ICD-10-CM

## 2017-09-09 DIAGNOSIS — R1013 Epigastric pain: Secondary | ICD-10-CM | POA: Diagnosis not present

## 2017-09-09 MED ORDER — TECHNETIUM TC 99M SULFUR COLLOID
2.2200 | Freq: Once | INTRAVENOUS | Status: AC | PRN
  Administered 2017-09-09: 2.22 via INTRAVENOUS

## 2017-11-07 ENCOUNTER — Encounter: Payer: Self-pay | Admitting: *Deleted

## 2017-11-08 ENCOUNTER — Other Ambulatory Visit: Payer: Self-pay

## 2017-11-08 ENCOUNTER — Ambulatory Visit
Admission: RE | Admit: 2017-11-08 | Discharge: 2017-11-08 | Disposition: A | Discharge status: Home / Self Care | Payer: BLUE CROSS/BLUE SHIELD | Source: Ambulatory Visit | Attending: Unknown Physician Specialty | Admitting: Unknown Physician Specialty

## 2017-11-08 ENCOUNTER — Encounter
Admission: RE | Disposition: A | Discharge status: Home / Self Care | Payer: Self-pay | Source: Ambulatory Visit | Attending: Unknown Physician Specialty

## 2017-11-08 ENCOUNTER — Encounter: Payer: Self-pay | Admitting: *Deleted

## 2017-11-08 ENCOUNTER — Ambulatory Visit: Payer: BLUE CROSS/BLUE SHIELD | Admitting: Anesthesiology

## 2017-11-08 DIAGNOSIS — K640 First degree hemorrhoids: Secondary | ICD-10-CM | POA: Diagnosis not present

## 2017-11-08 DIAGNOSIS — K219 Gastro-esophageal reflux disease without esophagitis: Secondary | ICD-10-CM | POA: Diagnosis not present

## 2017-11-08 DIAGNOSIS — Z794 Long term (current) use of insulin: Secondary | ICD-10-CM | POA: Diagnosis not present

## 2017-11-08 DIAGNOSIS — I10 Essential (primary) hypertension: Secondary | ICD-10-CM | POA: Insufficient documentation

## 2017-11-08 DIAGNOSIS — Z1211 Encounter for screening for malignant neoplasm of colon: Secondary | ICD-10-CM | POA: Diagnosis not present

## 2017-11-08 DIAGNOSIS — Z87891 Personal history of nicotine dependence: Secondary | ICD-10-CM | POA: Diagnosis not present

## 2017-11-08 DIAGNOSIS — G4730 Sleep apnea, unspecified: Secondary | ICD-10-CM | POA: Diagnosis not present

## 2017-11-08 DIAGNOSIS — Z791 Long term (current) use of non-steroidal anti-inflammatories (NSAID): Secondary | ICD-10-CM | POA: Diagnosis not present

## 2017-11-08 DIAGNOSIS — Z9989 Dependence on other enabling machines and devices: Secondary | ICD-10-CM | POA: Insufficient documentation

## 2017-11-08 DIAGNOSIS — Z79899 Other long term (current) drug therapy: Secondary | ICD-10-CM | POA: Insufficient documentation

## 2017-11-08 DIAGNOSIS — E119 Type 2 diabetes mellitus without complications: Secondary | ICD-10-CM | POA: Insufficient documentation

## 2017-11-08 HISTORY — DX: Sleep apnea, unspecified: G47.30

## 2017-11-08 HISTORY — PX: COLONOSCOPY WITH PROPOFOL: SHX5780

## 2017-11-08 HISTORY — DX: Hyperlipidemia, unspecified: E78.5

## 2017-11-08 HISTORY — DX: Essential (primary) hypertension: I10

## 2017-11-08 HISTORY — DX: Hypertrophic scar: L91.0

## 2017-11-08 HISTORY — DX: Cardiac murmur, unspecified: R01.1

## 2017-11-08 HISTORY — DX: Angina pectoris, unspecified: I20.9

## 2017-11-08 LAB — GLUCOSE, CAPILLARY: Glucose-Capillary: 131 mg/dL — ABNORMAL HIGH (ref 65–99)

## 2017-11-08 SURGERY — COLONOSCOPY WITH PROPOFOL
Anesthesia: General

## 2017-11-08 MED ORDER — SODIUM CHLORIDE 0.9 % IJ SOLN
INTRAMUSCULAR | Status: AC
  Filled 2017-11-08: qty 10

## 2017-11-08 MED ORDER — LIDOCAINE 2% (20 MG/ML) 5 ML SYRINGE
INTRAMUSCULAR | Status: DC | PRN
  Administered 2017-11-08: 30 mg via INTRAVENOUS

## 2017-11-08 MED ORDER — PROPOFOL 10 MG/ML IV BOLUS
INTRAVENOUS | Status: DC | PRN
  Administered 2017-11-08: 100 mg via INTRAVENOUS

## 2017-11-08 MED ORDER — SODIUM CHLORIDE 0.9 % IV SOLN
INTRAVENOUS | Status: DC
  Administered 2017-11-08 (×2): via INTRAVENOUS

## 2017-11-08 MED ORDER — SODIUM CHLORIDE 0.9 % IV SOLN: INTRAVENOUS | Status: DC

## 2017-11-08 MED ORDER — GLYCOPYRROLATE 0.2 MG/ML IJ SOLN
INTRAMUSCULAR | Status: AC
  Filled 2017-11-08: qty 1

## 2017-11-08 MED ORDER — FENTANYL CITRATE (PF) 100 MCG/2ML IJ SOLN
INTRAMUSCULAR | Status: AC
  Filled 2017-11-08: qty 2

## 2017-11-08 MED ORDER — PHENYLEPHRINE HCL 10 MG/ML IJ SOLN
INTRAMUSCULAR | Status: AC
  Filled 2017-11-08: qty 1

## 2017-11-08 MED ORDER — EPHEDRINE SULFATE 50 MG/ML IJ SOLN
INTRAMUSCULAR | Status: AC
  Filled 2017-11-08: qty 1

## 2017-11-08 MED ORDER — PROPOFOL 500 MG/50ML IV EMUL
INTRAVENOUS | Status: AC
  Filled 2017-11-08: qty 50

## 2017-11-08 MED ORDER — LIDOCAINE HCL (PF) 2 % IJ SOLN
INTRAMUSCULAR | Status: AC
  Filled 2017-11-08: qty 10

## 2017-11-08 MED ORDER — PROPOFOL 10 MG/ML IV BOLUS
INTRAVENOUS | Status: AC
Start: 2017-11-08 — End: ?
  Filled 2017-11-08: qty 20

## 2017-11-08 MED ORDER — PROPOFOL 500 MG/50ML IV EMUL
INTRAVENOUS | Status: DC | PRN
  Administered 2017-11-08: 160 ug/kg/min via INTRAVENOUS

## 2017-11-08 NOTE — Anesthesia Post-op Follow-up Note (Signed)
Anesthesia QCDR form completed.        

## 2017-11-08 NOTE — Anesthesia Preprocedure Evaluation (Signed)
Anesthesia Evaluation  Patient identified by MRN, date of birth, ID band Patient awake    Reviewed: Allergy & Precautions, H&P , NPO status , Patient's Chart, lab work & pertinent test results  History of Anesthesia Complications Negative for: history of anesthetic complications  Airway Mallampati: III  TM Distance: <3 FB Neck ROM: full    Dental  (+) Chipped, Poor Dentition   Pulmonary neg pulmonary ROS, neg shortness of breath, sleep apnea and Continuous Positive Airway Pressure Ventilation , former smoker,           Cardiovascular Exercise Tolerance: Good hypertension, (-) angina(-) Past MI negative cardio ROS       Neuro/Psych negative neurological ROS  negative psych ROS   GI/Hepatic Neg liver ROS, PUD, GERD  Medicated and Controlled,  Endo/Other  diabetes, Type 2  Renal/GU negative Renal ROS  negative genitourinary   Musculoskeletal   Abdominal   Peds  Hematology negative hematology ROS (+)   Anesthesia Other Findings Past Medical History: No date: Anginal pain (HCC) No date: Diabetes mellitus without complication (HCC) No date: Elevated lipids No date: H. pylori infection No date: Heart murmur No date: Hyperlipemia No date: Hypertension No date: Keloid No date: Multiple gastric ulcers No date: Murmur No date: Sleep apnea  Past Surgical History: No date: FRACTURE SURGERY 10/10/2016: LEFT HEART CATH AND CORONARY ANGIOGRAPHY; Left     Comment:  Procedure: Left Heart Cath and Coronary Angiography;                Surgeon: Alwyn Peawayne D Callwood, MD;  Location: [facility] INVASIVE               CV LAB;  Service: Cardiovascular;  Laterality: Left;  BMI    Body Mass Index:  30.07 kg/m      Reproductive/Obstetrics negative OB ROS                             Anesthesia Physical Anesthesia Plan  ASA: III  Anesthesia Plan: General   Post-op Pain Management:    Induction:  Intravenous  PONV Risk Score and Plan: Propofol infusion and TIVA  Airway Management Planned: Natural Airway and Nasal Cannula  Additional Equipment:   Intra-op Plan:   Post-operative Plan:   Informed Consent: I have reviewed the patients History and Physical, chart, labs and discussed the procedure including the risks, benefits and alternatives for the proposed anesthesia with the patient or authorized representative who has indicated his/her understanding and acceptance.   Dental Advisory Given  Plan Discussed with: Anesthesiologist, CRNA and Surgeon  Anesthesia Plan Comments: (Patient consented for risks of anesthesia including but not limited to:  - adverse reactions to medications - risk of intubation if required - damage to teeth, lips or other oral mucosa - sore throat or hoarseness - Damage to heart, brain, lungs or loss of life  Patient voiced understanding.)        Anesthesia Quick Evaluation

## 2017-11-08 NOTE — Transfer of Care (Signed)
Immediate Anesthesia Transfer of Care Note  Patient: Miguel Walters  Procedure(s) Performed: COLONOSCOPY WITH PROPOFOL (N/A )  Patient Location: PACU and Endoscopy Unit  Anesthesia Type:General  Level of Consciousness: sedated  Airway & Oxygen Therapy: Patient Spontanous Breathing and Patient connected to nasal cannula oxygen  Post-op Assessment: Report given to RN and Post -op Vital signs reviewed and stable  Post vital signs: Reviewed and stable  Last Vitals:  Vitals Value Taken Time  BP    Temp    Pulse    Resp    SpO2      Last Pain:  Vitals:   11/08/17 0708  TempSrc: Tympanic  PainSc: 0-No pain         Complications: No apparent anesthesia complications

## 2017-11-08 NOTE — H&P (Signed)
Primary Care Physician:  Dala DockMarkly, Linda, [place] Primary Gastroenterologist:  Dr. Mechele CollinElliott  Pre-Procedure History & Physical: HPI:  Miguel LombardDavid L Walters is a 52 y.o. male is here for an colonoscopy. This is for colon cancer screening.   Past Medical History:  Diagnosis Date  . Anginal pain (HCC)   . Diabetes mellitus without complication (HCC)   . Elevated lipids   . H. pylori infection   . Heart murmur   . Hyperlipemia   . Hypertension   . Keloid   . Multiple gastric ulcers   . Murmur   . Sleep apnea     Past Surgical History:  Procedure Laterality Date  . FRACTURE SURGERY    . LEFT HEART CATH AND CORONARY ANGIOGRAPHY Left 10/10/2016   Procedure: Left Heart Cath and Coronary Angiography;  Surgeon: Alwyn Peawayne D Callwood, MD;  Location: [facility] INVASIVE CV LAB;  Service: Cardiovascular;  Laterality: Left;    Prior to Admission medications   Medication Sig Start Date End Date Taking? Authorizing Provider  insulin detemir (LEVEMIR) 100 UNIT/ML injection Inject 80 Units into the skin 2 (two) times daily.    Yes [provider]  JARDIANCE 10 MG TABS tablet Take 10 mg by mouth daily. 06/04/17  Yes [provider]  naproxen (NAPROSYN) 500 MG tablet Take 1 tablet by mouth 2 (two) times daily. 07/03/17  Yes [provider]  omeprazole (PRILOSEC) 20 MG capsule Take 20 mg by mouth daily.   Yes [provider]  SitaGLIPtin-MetFORMIN HCl (JANUMET XR) 50-1000 MG TB24 Take 2 tablets by mouth daily with supper.   Yes [provider]  sucralfate (CARAFATE) 1 g tablet Take 1 tablet (1 g total) by mouth 4 (four) times daily. 07/16/16  Yes Sharman CheekStafford, Phillip, MD    Allergies as of 09/01/2017 - Review Complete 07/11/2017  Allergen Reaction Noted  . Codeine  01/18/2017  . Oxycodone Itching 05/30/2015  . Hydrocodone Rash 02/23/2015    History reviewed. No pertinent family history.  Social History   Socioeconomic History  . Marital status: Married    Spouse name:  Not on file  . Number of children: Not on file  . Years of education: Not on file  . Highest education level: Not on file  Occupational History  . Not on file  Social Needs  . Financial resource strain: Not on file  . Food insecurity:    Worry: Not on file    Inability: Not on file  . Transportation needs:    Medical: Not on file    Non-medical: Not on file  Tobacco Use  . Smoking status: Former Smoker    Last attempt to quit: 02/23/2007    Years since quitting: 10.7  . Smokeless tobacco: Never Used  Substance and Sexual Activity  . Alcohol use: No  . Drug use: No  . Sexual activity: Not on file  Lifestyle  . Physical activity:    Days per week: Not on file    Minutes per session: Not on file  . Stress: Not on file  Relationships  . Social connections:    Talks on phone: Not on file    Gets together: Not on file    Attends religious service: Not on file    Active member of club or organization: Not on file    Attends meetings of clubs or organizations: Not on file    Relationship status: Not on file  . Intimate partner violence:    Fear of current or ex  partner: Not on file    Emotionally abused: Not on file    Physically abused: Not on file    Forced sexual activity: Not on file  Other Topics Concern  . Not on file  Social History Narrative  . Not on file    Review of Systems: See HPI, otherwise negative ROS  Physical Exam: BP 132/90   Pulse 80   Temp (!) 97 F (36.1 C) (Tympanic)   Resp 20   Ht 5\' 7"  (1.702 m)   Wt 87.1 kg (192 lb)   SpO2 100%   BMI 30.07 kg/m  General:   Alert,  pleasant and cooperative in NAD Head:  Normocephalic and atraumatic. Neck:  Supple; no masses or thyromegaly. Lungs:  Clear throughout to auscultation.    Heart:  Regular rate and rhythm. Abdomen:  Soft, nontender and nondistended. Normal bowel sounds, without guarding, and without rebound.   Neurologic:  Alert and  oriented x4;  grossly normal  neurologically.  Impression/Plan: Miguel Walters is here for an colonoscopy to be performed for screening colonoscopy  Risks, benefits, limitations, and alternatives regarding  colonoscopy have been reviewed with the patient.  Questions have been answered.  All parties agreeable.   Lynnae Prude, MD  11/08/2017, 7:29 AM

## 2017-11-08 NOTE — Anesthesia Postprocedure Evaluation (Signed)
Anesthesia Post Note  Patient: Miguel Walters  Procedure(s) Performed: COLONOSCOPY WITH PROPOFOL (N/A )  Patient location during evaluation: Endoscopy Anesthesia Type: General Level of consciousness: awake and alert Pain management: pain level controlled Vital Signs Assessment: post-procedure vital signs reviewed and stable Respiratory status: spontaneous breathing, nonlabored ventilation, respiratory function stable and patient connected to nasal cannula oxygen Cardiovascular status: blood pressure returned to baseline and stable Postop Assessment: no apparent nausea or vomiting Anesthetic complications: no     Last Vitals:  Vitals:   11/08/17 0801 11/08/17 0830  BP:  110/86  Pulse: 60   Resp: 19   Temp: (!) 36 C   SpO2: 98%     Last Pain:  Vitals:   11/08/17 0800  TempSrc: Tympanic  PainSc: Asleep                 Cleda MccreedyJoseph K Kharter Brew

## 2017-11-08 NOTE — Op Note (Signed)
[facility] Gastroenterology Patient Name: Miguel Walters Procedure Date: 11/08/2017 7:32 AM MRN: [id number] Account #: [id number] Date of Birth: 10-01-1965 Admit Type: Outpatient Age: 52 Room: [facility] ENDO ROOM 4 Gender: Male Note Status: Finalized Procedure:            Colonoscopy Indications:          Screening for colorectal malignant neoplasm Providers:            Scot Jun, MD Referring MD:         No Local Md, MD (Referring MD) Medicines:            Propofol per Anesthesia Complications:        No immediate complications. Procedure:            Pre-Anesthesia Assessment:                       - After reviewing the risks and benefits, the patient                        was deemed in satisfactory condition to undergo the                        procedure.                       After obtaining informed consent, the colonoscope was                        passed under direct vision. Throughout the procedure,                        the patient's blood pressure, pulse, and oxygen                        saturations were monitored continuously. The                        Colonoscope was introduced through the anus and                        advanced to the the cecum, identified by appendiceal                        orifice and ileocecal valve. The colonoscopy was                        performed without difficulty. The patient tolerated the                        procedure well. The quality of the bowel preparation                        was excellent. Findings:      Internal hemorrhoids were found during endoscopy. The hemorrhoids were       small, medium-sized and Grade I (internal hemorrhoids that do not       prolapse).      The exam was otherwise without abnormality. Entire colon showed no       abnormality. Impression:           - Internal hemorrhoids.                       -  The examination was otherwise normal.                       - No specimens  collected. Recommendation:       - Repeat colonoscopy in 10 years for screening purposes. Scot Junobert T Taler Kushner, MD 11/08/2017 7:58:32 AM This report has been signed electronically. Number of Addenda: 0 Note Initiated On: 11/08/2017 7:32 AM Scope Withdrawal Time: 0 hours 10 minutes 39 seconds  Total Procedure Duration: 0 hours 16 minutes 58 seconds       [facility]

## 2017-11-21 ENCOUNTER — Other Ambulatory Visit: Payer: Self-pay

## 2017-11-21 ENCOUNTER — Emergency Department
Admission: EM | Admit: 2017-11-21 | Discharge: 2017-11-21 | Disposition: A | Discharge status: Home / Self Care | Payer: BLUE CROSS/BLUE SHIELD | Attending: Student in an Organized Health Care Education/Training Program | Admitting: Student in an Organized Health Care Education/Training Program

## 2017-11-21 ENCOUNTER — Emergency Department: Payer: BLUE CROSS/BLUE SHIELD

## 2017-11-21 ENCOUNTER — Encounter: Payer: Self-pay | Admitting: Emergency Medicine

## 2017-11-21 DIAGNOSIS — I10 Essential (primary) hypertension: Secondary | ICD-10-CM | POA: Insufficient documentation

## 2017-11-21 DIAGNOSIS — Z79899 Other long term (current) drug therapy: Secondary | ICD-10-CM | POA: Insufficient documentation

## 2017-11-21 DIAGNOSIS — Z794 Long term (current) use of insulin: Secondary | ICD-10-CM | POA: Insufficient documentation

## 2017-11-21 DIAGNOSIS — R310 Gross hematuria: Secondary | ICD-10-CM | POA: Diagnosis not present

## 2017-11-21 DIAGNOSIS — E119 Type 2 diabetes mellitus without complications: Secondary | ICD-10-CM | POA: Diagnosis not present

## 2017-11-21 DIAGNOSIS — Z87891 Personal history of nicotine dependence: Secondary | ICD-10-CM | POA: Insufficient documentation

## 2017-11-21 LAB — URINALYSIS, COMPLETE (UACMP) WITH MICROSCOPIC
BILIRUBIN URINE: NEGATIVE
Glucose, UA: 50 mg/dL — AB
KETONES UR: 5 mg/dL — AB
Leukocytes, UA: NEGATIVE
Nitrite: NEGATIVE
PH: 6 (ref 5.0–8.0)
Protein, ur: NEGATIVE mg/dL
Specific Gravity, Urine: 1.021 (ref 1.005–1.030)

## 2017-11-21 LAB — BASIC METABOLIC PANEL WITH GFR
Anion gap: 6 (ref 5–15)
BUN: 16 mg/dL (ref 6–20)
CO2: 26 mmol/L (ref 22–32)
Calcium: 9.3 mg/dL (ref 8.9–10.3)
Chloride: 106 mmol/L (ref 101–111)
Creatinine, Ser: 0.78 mg/dL (ref 0.61–1.24)
GFR calc Af Amer: >60 mL/min
GFR calc non Af Amer: >60 mL/min
Glucose, Bld: 109 mg/dL — ABNORMAL HIGH (ref 65–99)
Potassium: 3.6 mmol/L (ref 3.5–5.1)
Sodium: 138 mmol/L (ref 135–145)

## 2017-11-21 LAB — CBC
HCT: 45.3 % (ref 40.0–52.0)
Hemoglobin: 15.2 g/dL (ref 13.0–18.0)
MCH: 29.6 pg (ref 26.0–34.0)
MCHC: 33.5 g/dL (ref 32.0–36.0)
MCV: 88.4 fL (ref 80.0–100.0)
Platelets: 183 10*3/uL (ref 150–440)
RBC: 5.13 MIL/uL (ref 4.40–5.90)
RDW: 13.8 % (ref 11.5–14.5)
WBC: 7.4 10*3/uL (ref 3.8–10.6)

## 2017-11-21 MED ORDER — CEPHALEXIN 500 MG PO CAPS
500.0000 mg | ORAL_CAPSULE | Freq: Once | ORAL | Status: AC
  Administered 2017-11-21: 500 mg via ORAL
  Filled 2017-11-21: qty 1

## 2017-11-21 MED ORDER — CEPHALEXIN 500 MG PO CAPS: 500.0000 mg | ORAL_CAPSULE | Freq: Three times a day (TID) | ORAL | 0 refills | Status: AC

## 2017-11-21 NOTE — ED Triage Notes (Addendum)
Pt to ED from PCP with c/o hematuria that was pt noticed today. Pt states multiple clots with urine. VSS, pt ambulatory . Denies anticoags

## 2017-11-21 NOTE — ED Provider Notes (Signed)
[facility] Emergency Department Provider Note    First MD Initiated Contact with Patient 11/21/17 1951     (approximate)  I have reviewed the triage vital signs and the nursing notes.   HISTORY  Chief Complaint Hematuria    HPI Miguel Walters is a 52 y.o. male significant past medical history other than hypertension hypokalemia as well as diabetes presents with chief complaint dark colored urine.  Denies any dysuria.  Denies passing any clots or difficulty emptying his bladder.  No fevers.  No nausea or vomiting.  He does not smoke.  Is never had symptoms like this before.  No recent antibiotics.  Patient seen by his PCP and noted to have gross hematuria so sent to the ER for further evaluation.  He does not take any blood thinners.  Past Medical History:  Diagnosis Date  . Anginal pain (HCC)   . Diabetes mellitus without complication (HCC)   . Elevated lipids   . H. pylori infection   . Heart murmur   . Hyperlipemia   . Hypertension   . Keloid   . Multiple gastric ulcers   . Murmur   . Sleep apnea    No family history on file. Past Surgical History:  Procedure Laterality Date  . COLONOSCOPY WITH PROPOFOL N/A 11/08/2017   Procedure: COLONOSCOPY WITH PROPOFOL;  Surgeon: Scot Jun, MD;  Location: [facility] ENDOSCOPY;  Service: Endoscopy;  Laterality: N/A;  . FRACTURE SURGERY    . LEFT HEART CATH AND CORONARY ANGIOGRAPHY Left 10/10/2016   Procedure: Left Heart Cath and Coronary Angiography;  Surgeon: Alwyn Pea, MD;  Location: [facility] INVASIVE CV LAB;  Service: Cardiovascular;  Laterality: Left;   There are no active problems to display for this patient.     Prior to Admission medications   Medication Sig Start Date End Date Taking? Authorizing Provider  insulin detemir (LEVEMIR) 100 UNIT/ML injection Inject 80 Units into the skin 2 (two) times daily.     [provider]  JARDIANCE 10 MG TABS tablet Take 10 mg by mouth daily.  06/04/17   [provider]  naproxen (NAPROSYN) 500 MG tablet Take 1 tablet by mouth 2 (two) times daily. 07/03/17   [provider]  omeprazole (PRILOSEC) 20 MG capsule Take 20 mg by mouth daily.    [provider]  SitaGLIPtin-MetFORMIN HCl (JANUMET XR) 50-1000 MG TB24 Take 2 tablets by mouth daily with supper.    [provider]  sucralfate (CARAFATE) 1 g tablet Take 1 tablet (1 g total) by mouth 4 (four) times daily. 07/16/16   Sharman Cheek, MD    Allergies Codeine; Oxycodone; and Hydrocodone    Social History Social History   Tobacco Use  . Smoking status: Former Smoker    Last attempt to quit: 02/23/2007    Years since quitting: 10.7  . Smokeless tobacco: Never Used  Substance Use Topics  . Alcohol use: No  . Drug use: No    Review of Systems Patient denies headaches, rhinorrhea, blurry vision, numbness, shortness of breath, chest pain, edema, cough, abdominal pain, nausea, vomiting, diarrhea, dysuria, fevers, rashes or hallucinations unless otherwise stated above in HPI. ____________________________________________   PHYSICAL EXAM:  VITAL SIGNS: Vitals:   11/21/17 1706  BP: 136/80  Pulse: 64  Resp: 18  Temp: 98.5 F (36.9 C)  SpO2: 96%    Constitutional: Alert and oriented. Well appearing and in no acute distress. Eyes: Conjunctivae are normal.  Head: Atraumatic. Nose:  No congestion/rhinnorhea. Mouth/Throat: Mucous membranes are moist.   Neck: No stridor. Painless ROM.  Cardiovascular: Normal rate, regular rhythm. Grossly normal heart sounds.  Good peripheral circulation. Respiratory: Normal respiratory effort.  No retractions. Lungs CTAB. Gastrointestinal: Soft and nontender. No distention. No abdominal bruits. No CVA tenderness. Genitourinary:  Musculoskeletal: No lower extremity tenderness nor edema.  No joint effusions. Neurologic:  Normal speech and language. No gross focal neurologic deficits are appreciated. No  facial droop Skin:  Skin is warm, dry and intact. No rash noted. Psychiatric: Mood and affect are normal. Speech and behavior are normal.  ____________________________________________   LABS (all labs ordered are listed, but only abnormal results are displayed)  Results for orders placed or performed during the hospital encounter of 11/21/17 (from the past 24 hour(s))  Urinalysis, Complete w Microscopic     Status: Abnormal   Collection Time: 11/21/17  5:14 PM  Result Value Ref Range   Color, Urine YELLOW (A) YELLOW   APPearance HAZY (A) CLEAR   Specific Gravity, Urine 1.021 1.005 - 1.030   pH 6.0 5.0 - 8.0   Glucose, UA 50 (A) NEGATIVE mg/dL   Hgb urine dipstick LARGE (A) NEGATIVE   Bilirubin Urine NEGATIVE NEGATIVE   Ketones, ur 5 (A) NEGATIVE mg/dL   Protein, ur NEGATIVE NEGATIVE mg/dL   Nitrite NEGATIVE NEGATIVE   Leukocytes, UA NEGATIVE NEGATIVE   RBC / HPF 6-30 0 - 5 RBC/hpf   WBC, UA 0-5 0 - 5 WBC/hpf   Bacteria, UA RARE (A) NONE SEEN   Squamous Epithelial / LPF 0-5 (A) NONE SEEN   Mucus PRESENT    Crystals PRESENT (A) NEGATIVE  Basic metabolic panel     Status: Abnormal   Collection Time: 11/21/17  5:14 PM  Result Value Ref Range   Sodium 138 135 - 145 mmol/L   Potassium 3.6 3.5 - 5.1 mmol/L   Chloride 106 101 - 111 mmol/L   CO2 26 22 - 32 mmol/L   Glucose, Bld 109 (H) 65 - 99 mg/dL   BUN 16 6 - 20 mg/dL   Creatinine, Ser 4.090.78 0.61 - 1.24 mg/dL   Calcium 9.3 8.9 - 81.110.3 mg/dL   GFR calc non Af Amer >60 >60 mL/min   GFR calc Af Amer >60 >60 mL/min   Anion gap 6 5 - 15  CBC     Status: None   Collection Time: 11/21/17  5:14 PM  Result Value Ref Range   WBC 7.4 3.8 - 10.6 K/uL   RBC 5.13 4.40 - 5.90 MIL/uL   Hemoglobin 15.2 13.0 - 18.0 g/dL   HCT 91.445.3 78.240.0 - 95.652.0 %   MCV 88.4 80.0 - 100.0 fL   MCH 29.6 26.0 - 34.0 pg   MCHC 33.5 32.0 - 36.0 g/dL   RDW 21.313.8 08.611.5 - 57.814.5 %   Platelets 183 150 - 440 K/uL    ____________________________________________ ____________________________________________  RADIOLOGY  I personally reviewed all radiographic images ordered to evaluate for the above acute complaints and reviewed radiology reports and findings.  These findings were personally discussed with the patient.  Please see medical record for radiology report.  ____________________________________________   PROCEDURES  Procedure(s) performed:  Procedures    Critical Care performed: no ____________________________________________   INITIAL IMPRESSION / ASSESSMENT AND PLAN / ED COURSE  Pertinent labs & imaging results that were available during my care of the patient were reviewed by me and considered in my medical decision making (see chart for details).  DDX:  Stone, cystitis, mass  Miguel Walters is a 52 y.o. who presents to the ED with gross hematuria is painless.  Blood work does show rare bacteria.  Renal function is normal.  No anemia.  Patient on blood thinners.  No risk factors for malignancy the patient does not smoke and no family history.  CT imaging shows no evidence of nephrolithiasis bladder edema clots or obvious masses.  Will start patient on empiric Keflex as well as referral to urology as he likely will need cystoscopy for further evaluation.  Discussed signs and symptoms which she should return to the ER.  He does not have any bladder obstruction or urinary retention.  Encouraged p.o. Fluids.  Have discussed with the patient and available family all diagnostics and treatments performed thus far and all questions were answered to the best of my ability. The patient demonstrates understanding and agreement with plan.       As part of my medical decision making, I reviewed the following data within the electronic MEDICAL RECORD NUMBER Nursing notes reviewed and incorporated, Labs reviewed, notes from prior ED visits and [place] Controlled Substance  Database   ____________________________________________   FINAL CLINICAL IMPRESSION(S) / ED DIAGNOSES  Final diagnoses:  Gross hematuria      NEW MEDICATIONS STARTED DURING THIS VISIT:  New Prescriptions   No medications on file     Note:  This document was prepared using Dragon voice recognition software and may include unintentional dictation errors.    Willy Eddy, MD 11/21/17 2111

## 2017-11-22 ENCOUNTER — Encounter: Payer: Self-pay | Admitting: Emergency Medicine

## 2017-11-22 ENCOUNTER — Emergency Department
Admission: EM | Admit: 2017-11-22 | Discharge: 2017-11-22 | Disposition: A | Discharge status: Home / Self Care | Payer: BLUE CROSS/BLUE SHIELD | Attending: Emergency Medicine | Admitting: Emergency Medicine

## 2017-11-22 ENCOUNTER — Other Ambulatory Visit: Payer: Self-pay

## 2017-11-22 DIAGNOSIS — R339 Retention of urine, unspecified: Secondary | ICD-10-CM | POA: Diagnosis not present

## 2017-11-22 DIAGNOSIS — I10 Essential (primary) hypertension: Secondary | ICD-10-CM | POA: Diagnosis not present

## 2017-11-22 DIAGNOSIS — Z7984 Long term (current) use of oral hypoglycemic drugs: Secondary | ICD-10-CM | POA: Insufficient documentation

## 2017-11-22 DIAGNOSIS — Z79899 Other long term (current) drug therapy: Secondary | ICD-10-CM | POA: Diagnosis not present

## 2017-11-22 DIAGNOSIS — Z87891 Personal history of nicotine dependence: Secondary | ICD-10-CM | POA: Diagnosis not present

## 2017-11-22 DIAGNOSIS — E119 Type 2 diabetes mellitus without complications: Secondary | ICD-10-CM | POA: Diagnosis not present

## 2017-11-22 DIAGNOSIS — R319 Hematuria, unspecified: Secondary | ICD-10-CM

## 2017-11-22 LAB — CBC WITH DIFFERENTIAL/PLATELET
Basophils Absolute: 0 10*3/uL (ref 0–0.1)
Basophils Relative: 1 %
Eosinophils Absolute: 0.2 10*3/uL (ref 0–0.7)
Eosinophils Relative: 3 %
HEMATOCRIT: 42.2 % (ref 40.0–52.0)
HEMOGLOBIN: 14.4 g/dL (ref 13.0–18.0)
LYMPHS ABS: 1.7 10*3/uL (ref 1.0–3.6)
Lymphocytes Relative: 26 %
MCH: 30.2 pg (ref 26.0–34.0)
MCHC: 34.1 g/dL (ref 32.0–36.0)
MCV: 88.6 fL (ref 80.0–100.0)
MONO ABS: 0.7 10*3/uL (ref 0.2–1.0)
MONOS PCT: 11 %
NEUTROS ABS: 3.9 10*3/uL (ref 1.4–6.5)
Neutrophils Relative %: 59 %
Platelets: 180 10*3/uL (ref 150–440)
RBC: 4.77 MIL/uL (ref 4.40–5.90)
RDW: 13.6 % (ref 11.5–14.5)
WBC: 6.5 10*3/uL (ref 3.8–10.6)

## 2017-11-22 LAB — URINALYSIS, COMPLETE (UACMP) WITH MICROSCOPIC
Bilirubin Urine: NEGATIVE
GLUCOSE, UA: NEGATIVE mg/dL
Ketones, ur: 5 mg/dL — AB
Leukocytes, UA: NEGATIVE
NITRITE: NEGATIVE
PH: 5 (ref 5.0–8.0)
Protein, ur: NEGATIVE mg/dL
SPECIFIC GRAVITY, URINE: 1.027 (ref 1.005–1.030)
Squamous Epithelial / LPF: NONE SEEN

## 2017-11-22 LAB — BASIC METABOLIC PANEL
ANION GAP: 6 (ref 5–15)
BUN: 13 mg/dL (ref 6–20)
CHLORIDE: 106 mmol/L (ref 101–111)
CO2: 27 mmol/L (ref 22–32)
Calcium: 8.7 mg/dL — ABNORMAL LOW (ref 8.9–10.3)
Creatinine, Ser: 0.88 mg/dL (ref 0.61–1.24)
GFR calc Af Amer: >60 mL/min (ref >60)
GFR calc non Af Amer: >60 mL/min (ref >60)
GLUCOSE: 122 mg/dL — AB (ref 65–99)
Potassium: 3.5 mmol/L (ref 3.5–5.1)
Sodium: 139 mmol/L (ref 135–145)

## 2017-11-22 NOTE — ED Provider Notes (Signed)
[facility] Emergency Department Provider Note  ____________________________________________   I have reviewed the triage vital signs and the nursing notes. Where available I have reviewed prior notes and, if possible and indicated, outside hospital notes.    HISTORY  Chief Complaint Urinary Retention    HPI Miguel Walters is a 52 y.o. male seen here yesterday with hematuria, CT scan and blood work are reassuring, he was able to urinate and he went home.  This morning he did urinate again but then during the days had decreased urination and is worried that he is retaining.  He states he feels that there is urine in there he cannot get out.  Is not in severe pain.  Denies any fever flank pain nausea or vomiting or diarrhea denies dysuria.  He is taking Keflex. He has a sense of fullness is been there since this morning before he went to work.  Nothing makes it better nothing makes it worse no other associated symptoms aside from hematuria yesterday  Past Medical History:  Diagnosis Date  . Anginal pain (HCC)   . Diabetes mellitus without complication (HCC)   . Elevated lipids   . H. pylori infection   . Heart murmur   . Hyperlipemia   . Hypertension   . Keloid   . Multiple gastric ulcers   . Murmur   . Sleep apnea     There are no active problems to display for this patient.   Past Surgical History:  Procedure Laterality Date  . COLONOSCOPY WITH PROPOFOL N/A 11/08/2017   Procedure: COLONOSCOPY WITH PROPOFOL;  Surgeon: Scot Jun, MD;  Location: [facility] ENDOSCOPY;  Service: Endoscopy;  Laterality: N/A;  . FRACTURE SURGERY    . LEFT HEART CATH AND CORONARY ANGIOGRAPHY Left 10/10/2016   Procedure: Left Heart Cath and Coronary Angiography;  Surgeon: Alwyn Pea, MD;  Location: [facility] INVASIVE CV LAB;  Service: Cardiovascular;  Laterality: Left;    Prior to Admission medications   Medication Sig Start Date End Date Taking? Authorizing Provider   cephALEXin (KEFLEX) 500 MG capsule Take 1 capsule (500 mg total) by mouth 3 (three) times daily for 7 days. 11/21/17 11/28/17  Willy Eddy, MD  insulin detemir (LEVEMIR) 100 UNIT/ML injection Inject 80 Units into the skin 2 (two) times daily.     [provider]  JARDIANCE 10 MG TABS tablet Take 10 mg by mouth daily. 06/04/17   [provider]  naproxen (NAPROSYN) 500 MG tablet Take 1 tablet by mouth 2 (two) times daily. 07/03/17   [provider]  omeprazole (PRILOSEC) 20 MG capsule Take 20 mg by mouth daily.    [provider]  SitaGLIPtin-MetFORMIN HCl (JANUMET XR) 50-1000 MG TB24 Take 2 tablets by mouth daily with supper.    [provider]  sucralfate (CARAFATE) 1 g tablet Take 1 tablet (1 g total) by mouth 4 (four) times daily. 07/16/16   Sharman Cheek, MD    Allergies Codeine; Oxycodone; and Hydrocodone  No family history on file.  Social History Social History   Tobacco Use  . Smoking status: Former Smoker    Last attempt to quit: 02/23/2007    Years since quitting: 10.7  . Smokeless tobacco: Never Used  Substance Use Topics  . Alcohol use: No  . Drug use: No    Review of Systems Constitutional: No fever/chills Eyes: No visual changes. ENT: No sore throat. No stiff neck no neck pain Cardiovascular: Denies chest pain. Respiratory: Denies shortness  of breath. Gastrointestinal:   no vomiting.  No diarrhea.  No constipation. Genitourinary: Negative for dysuria. Musculoskeletal: Negative lower extremity swelling Skin: Negative for rash. Neurological: Negative for severe headaches, focal weakness or numbness.   ____________________________________________   PHYSICAL EXAM:  VITAL SIGNS: ED Triage Vitals  Enc Vitals Group     BP 11/22/17 1603 132/85     Pulse Rate 11/22/17 1603 70     Resp 11/22/17 1603 16     Temp 11/22/17 1603 98 F (36.7 C)     Temp Source 11/22/17 1603 Oral     SpO2 11/22/17 1603 99 %      Weight --      Height --      Head Circumference --      Peak Flow --      Pain Score 11/22/17 1602 10     Pain Loc --      Pain Edu? --      Excl. in GC? --     Constitutional: Alert and oriented. Well appearing and in no acute distress. Eyes: Conjunctivae are normal Head: Atraumatic HEENT: No congestion/rhinnorhea. Mucous membranes are moist.  Oropharynx non-erythematous Neck:   Nontender with no meningismus, no masses, no stridor Cardiovascular: Normal rate, regular rhythm. Grossly normal heart sounds.  Good peripheral circulation. Respiratory: Normal respiratory effort.  No retractions. Lungs CTAB. Abdominal: Soft and nontender. No distention. No guarding no rebound Back:  There is no focal tenderness or step off.  there is no midline tenderness there are no lesions noted. there is no CVA tenderness Normal external genitalia, nontender Musculoskeletal: No lower extremity tenderness, no upper extremity tenderness. No joint effusions, no DVT signs strong distal pulses no edema Neurologic:  Normal speech and language. No gross focal neurologic deficits are appreciated.  Skin:  Skin is warm, dry and intact. No rash noted. Psychiatric: Mood and affect are normal. Speech and behavior are normal.  ____________________________________________   LABS (all labs ordered are listed, but only abnormal results are displayed)  Labs Reviewed  URINALYSIS, COMPLETE (UACMP) WITH MICROSCOPIC - Abnormal; Notable for the following components:      Result Value   Color, Urine YELLOW (*)    APPearance HAZY (*)    Hgb urine dipstick MODERATE (*)    Ketones, ur 5 (*)    Bacteria, UA RARE (*)    All other components within normal limits  BASIC METABOLIC PANEL - Abnormal; Notable for the following components:   Glucose, Bld 122 (*)    Calcium 8.7 (*)    All other components within normal limits  URINE CULTURE  CBC WITH DIFFERENTIAL/PLATELET    Pertinent labs  results that were available during  my care of the patient were reviewed by me and considered in my medical decision making (see chart for details). ____________________________________________  EKG  I personally interpreted any EKGs ordered by me or triage  ____________________________________________  RADIOLOGY  Pertinent labs & imaging results that were available during my care of the patient were reviewed by me and considered in my medical decision making (see chart for details). If possible, patient and/or family made aware of any abnormal findings.  Ct Renal Stone Study  Result Date: 11/21/2017 CLINICAL DATA:  Hematuria EXAM: CT ABDOMEN AND PELVIS WITHOUT CONTRAST TECHNIQUE: Multidetector CT imaging of the abdomen and pelvis was performed following the standard protocol without IV contrast. COMPARISON:  04/10/2016 CT FINDINGS: Lower chest: No acute abnormality. Hepatobiliary: No focal liver abnormality is seen. No gallstones, gallbladder  wall thickening, or biliary dilatation. Contracted gallbladder Pancreas: Unremarkable. No pancreatic ductal dilatation or surrounding inflammatory changes. Spleen: Normal in size without focal abnormality. Adrenals/Urinary Tract: Adrenal glands are unremarkable. Kidneys are normal, without renal calculi, focal lesion, or hydronephrosis. Bladder is unremarkable. Stomach/Bowel: Stomach is within normal limits. Appendix appears normal. No evidence of bowel wall thickening, distention, or inflammatory changes. Vascular/Lymphatic: No significant vascular findings are present. No enlarged abdominal or pelvic lymph nodes. Reproductive: Prostate is unremarkable. Other: No abdominal wall hernia or abnormality. No abdominopelvic ascites. Musculoskeletal: No acute or significant osseous findings. IMPRESSION: 1. Negative for nephrolithiasis, hydronephrosis or ureteral stone 2. There are no acute intra-abdominal or pelvic abnormalities visualized Electronically Signed   By: Jasmine Pang M.D.   On: 11/21/2017  20:31   ____________________________________________    PROCEDURES  Procedure(s) performed: None  Procedures  Critical Care performed: None  ____________________________________________   INITIAL IMPRESSION / ASSESSMENT AND PLAN / ED COURSE  Pertinent labs & imaging results that were available during my care of the patient were reviewed by me and considered in my medical decision making (see chart for details).  Catheters placed with 300 cc out, patient feels better kidney function is preserved, blood work is reassuring, urinalysis shows nothing but hematuria, unclear the etiology discussed with Dr. Vanna Scotland of urology who agrees with catheter continued antibiotics and discharge which we will perform.  Patient in no acute distress.  Follow-up and return precautions given and understood    ____________________________________________   FINAL CLINICAL IMPRESSION(S) / ED DIAGNOSES  Final diagnoses:  None      This chart was dictated using voice recognition software.  Despite best efforts to proofread,  errors can occur which can change meaning.      Jeanmarie Plant, MD 11/22/17 [phone]

## 2017-11-22 NOTE — ED Notes (Signed)
Pt states urinary retention that began yesterday. States he dribbled some around 4am this morning but nothing since. Denies pain. States just pressure. Alert, oriented.   After foley placed pt urinating around catheter some.

## 2017-11-22 NOTE — ED Triage Notes (Addendum)
Pt to ED via POV, was seen yesterday for hematuria, was told to follow up with urology but if urinary retention to come back to ED. States he was d/c last night apro 2100 and has not urinated since then. VSS. Pt appears uncomfortable in triage.

## 2017-11-22 NOTE — Discharge Instructions (Addendum)
Return to the emergency room for any new or worsening symptoms including increased pain, significant bleeding, pain in your back, vomiting, fever or you feel worse in any way.  Follow closely with primary care doctor and urology.

## 2017-11-23 ENCOUNTER — Emergency Department
Admission: EM | Admit: 2017-11-23 | Discharge: 2017-11-23 | Disposition: A | Discharge status: Home / Self Care | Payer: BLUE CROSS/BLUE SHIELD | Attending: Emergency Medicine | Admitting: Emergency Medicine

## 2017-11-23 DIAGNOSIS — Z87891 Personal history of nicotine dependence: Secondary | ICD-10-CM | POA: Insufficient documentation

## 2017-11-23 DIAGNOSIS — Z794 Long term (current) use of insulin: Secondary | ICD-10-CM | POA: Insufficient documentation

## 2017-11-23 DIAGNOSIS — E119 Type 2 diabetes mellitus without complications: Secondary | ICD-10-CM | POA: Insufficient documentation

## 2017-11-23 DIAGNOSIS — Z79899 Other long term (current) drug therapy: Secondary | ICD-10-CM | POA: Insufficient documentation

## 2017-11-23 DIAGNOSIS — R319 Hematuria, unspecified: Secondary | ICD-10-CM

## 2017-11-23 LAB — URINALYSIS, COMPLETE (UACMP) WITH MICROSCOPIC
BACTERIA UA: NONE SEEN
BILIRUBIN URINE: NEGATIVE
KETONES UR: NEGATIVE mg/dL
LEUKOCYTES UA: NEGATIVE
NITRITE: NEGATIVE
PH: 6 (ref 5.0–8.0)
Protein, ur: 100 mg/dL — AB
Specific Gravity, Urine: 1.023 (ref 1.005–1.030)

## 2017-11-23 MED ORDER — TAMSULOSIN HCL 0.4 MG PO CAPS
0.4000 mg | ORAL_CAPSULE | Freq: Once | ORAL | Status: AC
  Administered 2017-11-23: 0.4 mg via ORAL
  Filled 2017-11-23: qty 1

## 2017-11-23 NOTE — ED Triage Notes (Signed)
Patient reports he had a urinary catheter placed in this ED yesterday for urinary retention. Patient reports that he noticed blood in his urine drainage bag at work this evening. Patient c/o penile pain at 10 of 10.

## 2017-11-23 NOTE — ED Provider Notes (Signed)
[facility] Emergency Department Provider Note  ____________________________________________  Time seen: Approximately 8:00 AM  I have reviewed the triage vital signs and the nursing notes.   HISTORY  Chief Complaint Hematuria   HPI Miguel Walters is a 52 y.o. male who presents for evaluation for hematuria. Patient seen here three times this week for hematuria, now with foley catheter for retention. Patient presents for continuing hematuria. Symptoms have been ongoing for 3 days intermittently and currently mild (blood tinged urine). Patient has not contacted Urology yet. Has not been taking Flomax. He denies abdominal pain, dysuria, penile pain, nausea, vomiting, fever, chills, flank pain. Patient tells me that it is very hard for him to work with the urine bag on his leg. Patient not on blood thinners. Denies dizziness, CP or SOB.  Past Medical History:  Diagnosis Date  . Anginal pain (HCC)   . Diabetes mellitus without complication (HCC)   . Elevated lipids   . H. pylori infection   . Heart murmur   . Hyperlipemia   . Hypertension   . Keloid   . Multiple gastric ulcers   . Murmur   . Sleep apnea     There are no active problems to display for this patient.   Past Surgical History:  Procedure Laterality Date  . COLONOSCOPY WITH PROPOFOL N/A 11/08/2017   Procedure: COLONOSCOPY WITH PROPOFOL;  Surgeon: Scot Jun, MD;  Location: [facility] ENDOSCOPY;  Service: Endoscopy;  Laterality: N/A;  . FRACTURE SURGERY    . LEFT HEART CATH AND CORONARY ANGIOGRAPHY Left 10/10/2016   Procedure: Left Heart Cath and Coronary Angiography;  Surgeon: Alwyn Pea, MD;  Location: [facility] INVASIVE CV LAB;  Service: Cardiovascular;  Laterality: Left;    Prior to Admission medications   Medication Sig Start Date End Date Taking? Authorizing Provider  cephALEXin (KEFLEX) 500 MG capsule Take 1 capsule (500 mg total) by mouth 3 (three) times daily for 7 days. 11/21/17  11/28/17  Willy Eddy, MD  insulin detemir (LEVEMIR) 100 UNIT/ML injection Inject 80 Units into the skin 2 (two) times daily.     [provider]  JARDIANCE 10 MG TABS tablet Take 10 mg by mouth daily. 06/04/17   [provider]  naproxen (NAPROSYN) 500 MG tablet Take 1 tablet by mouth 2 (two) times daily. 07/03/17   [provider]  omeprazole (PRILOSEC) 20 MG capsule Take 20 mg by mouth daily.    [provider]  SitaGLIPtin-MetFORMIN HCl (JANUMET XR) 50-1000 MG TB24 Take 2 tablets by mouth daily with supper.    [provider]  sucralfate (CARAFATE) 1 g tablet Take 1 tablet (1 g total) by mouth 4 (four) times daily. 07/16/16   Sharman Cheek, MD    Allergies Codeine; Oxycodone; and Hydrocodone  No family history on file.  Social History Social History   Tobacco Use  . Smoking status: Former Smoker    Last attempt to quit: 02/23/2007    Years since quitting: 10.7  . Smokeless tobacco: Never Used  Substance Use Topics  . Alcohol use: No  . Drug use: No    Review of Systems  Constitutional: Negative for fever. Eyes: Negative for visual changes. ENT: Negative for sore throat. Neck: No neck pain  Cardiovascular: Negative for chest pain. Respiratory: Negative for shortness of breath. Gastrointestinal: Negative for abdominal pain, vomiting or diarrhea. Genitourinary: Negative for dysuria. + hematuria Musculoskeletal: Negative for back pain. Skin: Negative for rash. Neurological: Negative for headaches, weakness  or numbness. Psych: No SI or HI  ____________________________________________   PHYSICAL EXAM:  VITAL SIGNS: ED Triage Vitals  Enc Vitals Group     BP 11/23/17 0601 (!) 139/95     Pulse Rate 11/23/17 0601 70     Resp 11/23/17 0601 17     Temp 11/23/17 0601 97.8 F (36.6 C)     Temp Source 11/23/17 0601 Oral     SpO2 11/23/17 0601 98 %     Weight 11/23/17 0604 193 lb (87.5 kg)     Height --      Head  Circumference --      Peak Flow --      Pain Score 11/23/17 0604 10     Pain Loc --      Pain Edu? --      Excl. in GC? --     Constitutional: Alert and oriented. Well appearing and in no apparent distress. HEENT:      Head: Normocephalic and atraumatic.         Eyes: Conjunctivae are normal. Sclera is non-icteric.       Mouth/Throat: Mucous membranes are moist.       Neck: Supple with no signs of meningismus. Cardiovascular: Regular rate and rhythm. No murmurs, gallops, or rubs. 2+ symmetrical distal pulses are present in all extremities. No JVD. Respiratory: Normal respiratory effort. Lungs are clear to auscultation bilaterally. No wheezes, crackles, or rhonchi.  Gastrointestinal: Soft, non tender, and non distended with positive bowel sounds. No rebound or guarding. Genitourinary: No CVA tenderness. Foley in place draining blood tinged urine.  Musculoskeletal: Nontender with normal range of motion in all extremities. No edema, cyanosis, or erythema of extremities. Neurologic: Normal speech and language. Face is symmetric. Moving all extremities. No gross focal neurologic deficits are appreciated. Skin: Skin is warm, dry and intact. No rash noted. Psychiatric: Mood and affect are normal. Speech and behavior are normal.  ____________________________________________   LABS (all labs ordered are listed, but only abnormal results are displayed)  Labs Reviewed  URINALYSIS, COMPLETE (UACMP) WITH MICROSCOPIC - Abnormal; Notable for the following components:      Result Value   Color, Urine YELLOW (*)    APPearance CLOUDY (*)    Glucose, UA >=500 (*)    Hgb urine dipstick LARGE (*)    Protein, ur 100 (*)    Squamous Epithelial / LPF 0-5 (*)    Crystals PRESENT (*)    All other components within normal limits   ____________________________________________  EKG  none ____________________________________________  RADIOLOGY  none   ____________________________________________   PROCEDURES  Procedure(s) performed: None Procedures Critical Care performed:  None ____________________________________________   INITIAL IMPRESSION / ASSESSMENT AND PLAN / ED COURSE   52 y.o. male who presents for evaluation for persistent hematuria. Patient reassured that it may take several days for the hematuria to clear and the importance of close f/u with Urology for TOV and further diagnostic tests to determine etiology of hematuria. Recommended that patient continues to take daily flomax. No signs of UTI or severe blood loss anemia. I reviewed the prior two visits and patient has undergo labs showing normal creatinine and CT with no acute etiology for the hematuria.   Patient will be dc at this time.      As part of my medical decision making, I reviewed the following data within the electronic MEDICAL RECORD NUMBER Nursing notes reviewed and incorporated, Labs reviewed , Old chart reviewed, Notes from prior ED visits and  [place] Controlled Substance Database    Pertinent labs & imaging results that were available during my care of the patient were reviewed by me and considered in my medical decision making (see chart for details).    ____________________________________________   FINAL CLINICAL IMPRESSION(S) / ED DIAGNOSES  Final diagnoses:  Hematuria, unspecified type      NEW MEDICATIONS STARTED DURING THIS VISIT:  ED Discharge Orders    None       Note:  This document was prepared using Dragon voice recognition software and may include unintentional dictation errors.    Nita Sickle, MD 11/23/17 [phone]

## 2017-11-23 NOTE — ED Notes (Signed)
Bladder scan performed. No more than 7ml retained.

## 2017-11-24 LAB — URINE CULTURE
Culture: >=100000 — AB
Culture: NO GROWTH

## 2017-12-03 ENCOUNTER — Other Ambulatory Visit: Payer: Self-pay

## 2017-12-03 ENCOUNTER — Emergency Department
Admission: EM | Admit: 2017-12-03 | Discharge: 2017-12-03 | Disposition: A | Discharge status: Home / Self Care | Payer: BLUE CROSS/BLUE SHIELD | Attending: Emergency Medicine | Admitting: Emergency Medicine

## 2017-12-03 DIAGNOSIS — R319 Hematuria, unspecified: Secondary | ICD-10-CM | POA: Diagnosis present

## 2017-12-03 DIAGNOSIS — I10 Essential (primary) hypertension: Secondary | ICD-10-CM | POA: Diagnosis not present

## 2017-12-03 DIAGNOSIS — Z87891 Personal history of nicotine dependence: Secondary | ICD-10-CM | POA: Diagnosis not present

## 2017-12-03 DIAGNOSIS — E119 Type 2 diabetes mellitus without complications: Secondary | ICD-10-CM | POA: Insufficient documentation

## 2017-12-03 DIAGNOSIS — Z794 Long term (current) use of insulin: Secondary | ICD-10-CM | POA: Diagnosis not present

## 2017-12-03 LAB — URINALYSIS, ROUTINE W REFLEX MICROSCOPIC
Bilirubin Urine: NEGATIVE
Glucose, UA: >=500 mg/dL — AB
Ketones, ur: 5 mg/dL — AB
Leukocytes, UA: NEGATIVE
Nitrite: NEGATIVE
PROTEIN: 30 mg/dL — AB
Specific Gravity, Urine: 1.032 — ABNORMAL HIGH (ref 1.005–1.030)
pH: 5 (ref 5.0–8.0)

## 2017-12-03 LAB — COMPREHENSIVE METABOLIC PANEL
ALT: 28 U/L (ref 17–63)
AST: 25 U/L (ref 15–41)
Albumin: 4 g/dL (ref 3.5–5.0)
Alkaline Phosphatase: 79 U/L (ref 38–126)
Anion gap: 6 (ref 5–15)
BUN: 16 mg/dL (ref 6–20)
CHLORIDE: 105 mmol/L (ref 101–111)
CO2: 25 mmol/L (ref 22–32)
CREATININE: 0.86 mg/dL (ref 0.61–1.24)
Calcium: 8.7 mg/dL — ABNORMAL LOW (ref 8.9–10.3)
GFR calc non Af Amer: >60 mL/min (ref >60)
Glucose, Bld: 104 mg/dL — ABNORMAL HIGH (ref 65–99)
Potassium: 3.7 mmol/L (ref 3.5–5.1)
Sodium: 136 mmol/L (ref 135–145)
Total Bilirubin: 0.7 mg/dL (ref 0.3–1.2)
Total Protein: 7.2 g/dL (ref 6.5–8.1)

## 2017-12-03 LAB — CBC WITH DIFFERENTIAL/PLATELET
Basophils Absolute: 0 10*3/uL (ref 0–0.1)
Basophils Relative: 1 %
EOS ABS: 0.2 10*3/uL (ref 0–0.7)
Eosinophils Relative: 3 %
HEMATOCRIT: 42.4 % (ref 40.0–52.0)
HEMOGLOBIN: 14.8 g/dL (ref 13.0–18.0)
Lymphocytes Relative: 32 %
Lymphs Abs: 2.3 10*3/uL (ref 1.0–3.6)
MCH: 30.8 pg (ref 26.0–34.0)
MCHC: 34.9 g/dL (ref 32.0–36.0)
MCV: 88.4 fL (ref 80.0–100.0)
MONOS PCT: 9 %
Monocytes Absolute: 0.7 10*3/uL (ref 0.2–1.0)
NEUTROS ABS: 4 10*3/uL (ref 1.4–6.5)
NEUTROS PCT: 55 %
Platelets: 196 10*3/uL (ref 150–440)
RBC: 4.8 MIL/uL (ref 4.40–5.90)
RDW: 13.7 % (ref 11.5–14.5)
WBC: 7.2 10*3/uL (ref 3.8–10.6)

## 2017-12-03 NOTE — ED Notes (Signed)
Pt founf in room att, EDP at bedside,   Pt reports bleeding while peeing - pt has catheter in place, pt reports poor appetite, and feeling tired  Pt has follow up with urologist this Friday

## 2017-12-03 NOTE — ED Triage Notes (Signed)
Patient reports noticed blood in his urine last pm.

## 2017-12-03 NOTE — Discharge Instructions (Addendum)
Please seek medical attention for any high fevers, chest pain, shortness of breath, change in behavior, persistent vomiting, bloody stool or any other new or concerning symptoms.  

## 2017-12-03 NOTE — ED Provider Notes (Signed)
[facility] Emergency Department Provider Note  ____________________________________________   I have reviewed the triage vital signs and the nursing notes.   HISTORY  Chief Complaint Hematuria   History limited by: Not Limited   HPI Miguel Walters is a 52 y.o. male who presents to the emergency department today because of hematuria.  The patient states that it started last night and then cleared up but started again this morning.  Patient was seen in the emergency department roughly 10 days ago for the same complaint and still has a Foley catheter in.  States that he has been feeling weak.  He feels like the catheter has been draining appropriately.  Denies any fevers chest pain or shortness of breath.  Has an appointment scheduled with urology later this week.    Per medical record review patient has a history of recent ed visit for hematuria.   Past Medical History:  Diagnosis Date  . Anginal pain (HCC)   . Diabetes mellitus without complication (HCC)   . Elevated lipids   . H. pylori infection   . Heart murmur   . Hyperlipemia   . Hypertension   . Keloid   . Multiple gastric ulcers   . Murmur   . Sleep apnea     There are no active problems to display for this patient.   Past Surgical History:  Procedure Laterality Date  . COLONOSCOPY WITH PROPOFOL N/A 11/08/2017   Procedure: COLONOSCOPY WITH PROPOFOL;  Surgeon: Scot Jun, MD;  Location: [facility] ENDOSCOPY;  Service: Endoscopy;  Laterality: N/A;  . FRACTURE SURGERY    . LEFT HEART CATH AND CORONARY ANGIOGRAPHY Left 10/10/2016   Procedure: Left Heart Cath and Coronary Angiography;  Surgeon: Alwyn Pea, MD;  Location: [facility] INVASIVE CV LAB;  Service: Cardiovascular;  Laterality: Left;    Prior to Admission medications   Medication Sig Start Date End Date Taking? Authorizing Provider  insulin detemir (LEVEMIR) 100 UNIT/ML injection Inject 80 Units into the skin 2 (two) times daily.      [provider]  JARDIANCE 10 MG TABS tablet Take 10 mg by mouth daily. 06/04/17   [provider]  naproxen (NAPROSYN) 500 MG tablet Take 1 tablet by mouth 2 (two) times daily. 07/03/17   [provider]  omeprazole (PRILOSEC) 20 MG capsule Take 20 mg by mouth daily.    [provider]  SitaGLIPtin-MetFORMIN HCl (JANUMET XR) 50-1000 MG TB24 Take 2 tablets by mouth daily with supper.    [provider]  sucralfate (CARAFATE) 1 g tablet Take 1 tablet (1 g total) by mouth 4 (four) times daily. 07/16/16   Sharman Cheek, MD    Allergies Codeine; Oxycodone; and Hydrocodone  No family history on file.  Social History Social History   Tobacco Use  . Smoking status: Former Smoker    Last attempt to quit: 02/23/2007    Years since quitting: 10.7  . Smokeless tobacco: Never Used  Substance Use Topics  . Alcohol use: No  . Drug use: No    Review of Systems Constitutional: No fever/chills Eyes: No visual changes. ENT: No sore throat. Cardiovascular: Denies chest pain. Respiratory: Denies shortness of breath. Gastrointestinal: No abdominal pain.  No nausea, no vomiting.  No diarrhea.   Genitourinary: Positive for hematuria.  Musculoskeletal: Negative for back pain. Skin: Negative for rash. Neurological: Negative for headaches, focal weakness or numbness.  ____________________________________________   PHYSICAL EXAM:  VITAL SIGNS: ED Triage Vitals [12/03/17 2006]  Enc Vitals Group     BP (!) 137/91     Pulse Rate 69     Resp 18     Temp 98.7 F (37.1 C)     Temp Source Oral     SpO2 97 %   Constitutional: Alert and oriented. Well appearing and in no distress. Eyes: Conjunctivae are normal.  ENT   Head: Normocephalic and atraumatic.   Nose: No congestion/rhinnorhea.   Mouth/Throat: Mucous membranes are moist.   Neck: No stridor. Hematological/Lymphatic/Immunilogical: No cervical  lymphadenopathy. Cardiovascular: Normal rate, regular rhythm.  No murmurs, rubs, or gallops.  Respiratory: Normal respiratory effort without tachypnea nor retractions. Breath sounds are clear and equal bilaterally. No wheezes/rales/rhonchi. Gastrointestinal: Soft and non tender. No rebound. No guarding.  Genitourinary: Deferred Musculoskeletal: Normal range of motion in all extremities. No lower extremity edema. Neurologic:  Normal speech and language. No gross focal neurologic deficits are appreciated.  Skin:  Skin is warm, dry and intact. No rash noted. Psychiatric: Mood and affect are normal. Speech and behavior are normal. Patient exhibits appropriate insight and judgment.  ____________________________________________    LABS (pertinent positives/negatives)  UA too numerous to count RBCs, 0-5 wbc CBC wnl CMP cr 0.86, k 3.7 ____________________________________________   EKG  None  ____________________________________________    RADIOLOGY  None  ____________________________________________   PROCEDURES  Procedures  ____________________________________________   INITIAL IMPRESSION / ASSESSMENT AND PLAN / ED COURSE  Pertinent labs & imaging results that were available during my care of the patient were reviewed by me and considered in my medical decision making (see chart for details).  Patient presented to the emergency department today because of concerns for hematuria.  Patient has been seen in the ED previously for this.  Patient does have appointment with urology scheduled for this week.  Patient's blood work without any signs of acute kidney injury or anemia.  Will discharge to follow-up with urology.   ____________________________________________   FINAL CLINICAL IMPRESSION(S) / ED DIAGNOSES  Final diagnoses:  Hematuria, unspecified type     Note: This dictation was prepared with Dragon dictation. Any transcriptional errors that result from this  process are unintentional     Phineas SemenGoodman, Pesach Frisch, MD 12/03/17 2318

## 2017-12-08 ENCOUNTER — Encounter

## 2017-12-08 ENCOUNTER — Ambulatory Visit (INDEPENDENT_AMBULATORY_CARE_PROVIDER_SITE_OTHER): Payer: BLUE CROSS/BLUE SHIELD | Admitting: Urology

## 2017-12-08 ENCOUNTER — Encounter: Payer: Self-pay | Admitting: Urology

## 2017-12-08 VITALS — BP 134/89 | HR 58 | Resp 16 | Ht 67.0 in | Wt 190.8 lb

## 2017-12-08 DIAGNOSIS — R310 Gross hematuria: Secondary | ICD-10-CM

## 2017-12-08 DIAGNOSIS — R339 Retention of urine, unspecified: Secondary | ICD-10-CM

## 2017-12-08 MED ORDER — CIPROFLOXACIN HCL 500 MG PO TABS
500.0000 mg | ORAL_TABLET | Freq: Once | ORAL | Status: AC
  Administered 2017-12-08: 500 mg via ORAL

## 2017-12-08 NOTE — Progress Notes (Signed)
Catheter Removal  Patient is present today for a catheter removal.  8 ml of water was drained from the balloon. A 16 FR foley cath was removed from the bladder no complications were noted . Patient tolerated well.  Preformed by: Nydia Boutonhristopher Teegan Guinther, CMA  Follow up/ Additional notes: pt instructed to drink plenty of water and to contact our office by 3pm if unable to urinate.

## 2017-12-08 NOTE — Progress Notes (Signed)
12/08/2017 8:52 AM   Lynita Lombard 1966/07/06 [id number]  Referring provider: Dala Dock, PA [address] [place], [place] [address]  Chief Complaint  Patient presents with  . Hematuria  . Urinary Retention    HPI: Miguel Walters is a 52 year old male who developed gross hematuria with clots on 11/21/2017.  He was sent to the ED by his PCP.  He described his urine as cola colored but in the ED denied passing clots.  He was not on anticoagulant therapy. Urinalysis was yellow and hazy with 6-30 RBCs.  Urine culture grew 100,000 colonies of coagulase-negative Staphylococcus.  He return to the ED on 11/22/2017 complaining of progressive difficulty voiding.  A Foley catheter was placed with return of 300 cc of urine.  The catheter was left indwelling and he was started on antibiotic therapy.  A stone protocol CT of the abdomen pelvis was performed which showed no significant abnormalities. He had additional ED visits on 4/11 and 4/21 complaining of persistent hematuria however his catheter was draining appropriately.  Urinalyses at those visits showed grossly clear urine with too numerous to count RBCs.  He has no complaints today.  He denies previous history of gross hematuria, urinary retention or previous urologic evaluation.  PMH: Past Medical History:  Diagnosis Date  . Anginal pain (HCC)   . Diabetes mellitus without complication (HCC)   . Elevated lipids   . H. pylori infection   . Heart murmur   . Hyperlipemia   . Hypertension   . Keloid   . Multiple gastric ulcers   . Murmur   . Sleep apnea     Surgical History: Past Surgical History:  Procedure Laterality Date  . COLONOSCOPY WITH PROPOFOL N/A 11/08/2017   Procedure: COLONOSCOPY WITH PROPOFOL;  Surgeon: Scot Jun, MD;  Location: [facility] ENDOSCOPY;  Service: Endoscopy;  Laterality: N/A;  . FRACTURE SURGERY    . LEFT HEART CATH AND CORONARY ANGIOGRAPHY Left 10/10/2016   Procedure: Left Heart Cath and Coronary  Angiography;  Surgeon: Alwyn Pea, MD;  Location: [facility] INVASIVE CV LAB;  Service: Cardiovascular;  Laterality: Left;    Home Medications:  Allergies as of 12/08/2017      Reactions   Codeine    Oxycodone Itching   Hydrocodone Rash      Medication List        Accurate as of 12/08/17  8:52 AM. Always use your most recent med list.          insulin detemir 100 UNIT/ML injection Commonly known as:  LEVEMIR Inject 80 Units into the skin 2 (two) times daily.   JANUMET XR 50-1000 MG Tb24 Generic drug:  SitaGLIPtin-MetFORMIN HCl Take 2 tablets by mouth daily with supper.   JARDIANCE 10 MG Tabs tablet Generic drug:  empagliflozin Take 10 mg by mouth daily.   naproxen 500 MG tablet Commonly known as:  NAPROSYN Take 1 tablet by mouth 2 (two) times daily.   omeprazole 20 MG capsule Commonly known as:  PRILOSEC Take 20 mg by mouth daily.   sucralfate 1 g tablet Commonly known as:  CARAFATE Take 1 tablet (1 g total) by mouth 4 (four) times daily.       Allergies:  Allergies  Allergen Reactions  . Codeine   . Oxycodone Itching  . Hydrocodone Rash    Family History: History reviewed. No pertinent family history.  Social History:  reports that he quit smoking about 10 years ago. He has never used smokeless tobacco.  He reports that he does not drink alcohol or use drugs.  ROS: UROLOGY Frequent Urination?: No Hard to postpone urination?: No Burning/pain with urination?: No Get up at night to urinate?: No Leakage of urine?: No Urine stream starts and stops?: No Trouble starting stream?: No Do you have to strain to urinate?: Yes Blood in urine?: Yes Urinary tract infection?: No Sexually transmitted disease?: No Injury to kidneys or bladder?: No Painful intercourse?: No Weak stream?: No Erection problems?: Yes Penile pain?: No  Gastrointestinal Nausea?: No Vomiting?: No Indigestion/heartburn?: No Diarrhea?: No Constipation?:  No  Constitutional Fever: No Night sweats?: No Weight loss?: No Fatigue?: No  Skin Skin rash/lesions?: No Itching?: No  Eyes Blurred vision?: No Double vision?: No  Ears/Nose/Throat Sore throat?: No Sinus problems?: No  Hematologic/Lymphatic Swollen glands?: No Easy bruising?: No  Cardiovascular Leg swelling?: No Chest pain?: No  Respiratory Cough?: No Shortness of breath?: No  Endocrine Excessive thirst?: No  Musculoskeletal Back pain?: No Joint pain?: No  Neurological Headaches?: No Dizziness?: No  Psychologic Depression?: No Anxiety?: No  Physical Exam: BP 134/89   Pulse (!) 58   Resp 16   Ht 5\' 7"  (1.702 m)   Wt 190 lb 12.8 oz (86.5 kg)   SpO2 98%   BMI 29.88 kg/m   Constitutional:  Alert and oriented, No acute distress. HEENT: [place] AT, moist mucus membranes.  Trachea midline, no masses. Cardiovascular: No clubbing, cyanosis, or edema. Respiratory: Normal respiratory effort, no increased work of breathing. GI: Abdomen is soft, nontender, nondistended, no abdominal masses GU: No CVA tenderness.  Penis without lesions.  Foley catheter draining clear urine.  Testes bilaterally atrophic without mass or tenderness.  Prostate 45 g, smooth without nodules. Lymph: No cervical or inguinal lymphadenopathy. Skin: No rashes, bruises or suspicious lesions. Neurologic: Grossly intact, no focal deficits, moving all 4 extremities. Psychiatric: Normal mood and affect.  Laboratory Data: Lab Results  Component Value Date   WBC 7.2 12/03/2017   HGB 14.8 12/03/2017   HCT 42.4 12/03/2017   MCV 88.4 12/03/2017   PLT 196 12/03/2017    Lab Results  Component Value Date   CREATININE 0.86 12/03/2017     Pertinent Imaging: CT images reviewed Results for orders placed during the hospital encounter of 11/21/17  CT Renal Stone Study   Narrative CLINICAL DATA:  Hematuria  EXAM: CT ABDOMEN AND PELVIS WITHOUT CONTRAST  TECHNIQUE: Multidetector CT imaging  of the abdomen and pelvis was performed following the standard protocol without IV contrast.  COMPARISON:  04/10/2016 CT  FINDINGS: Lower chest: No acute abnormality.  Hepatobiliary: No focal liver abnormality is seen. No gallstones, gallbladder wall thickening, or biliary dilatation. Contracted gallbladder  Pancreas: Unremarkable. No pancreatic ductal dilatation or surrounding inflammatory changes.  Spleen: Normal in size without focal abnormality.  Adrenals/Urinary Tract: Adrenal glands are unremarkable. Kidneys are normal, without renal calculi, focal lesion, or hydronephrosis. Bladder is unremarkable.  Stomach/Bowel: Stomach is within normal limits. Appendix appears normal. No evidence of bowel wall thickening, distention, or inflammatory changes.  Vascular/Lymphatic: No significant vascular findings are present. No enlarged abdominal or pelvic lymph nodes.  Reproductive: Prostate is unremarkable.  Other: No abdominal wall hernia or abnormality. No abdominopelvic ascites.  Musculoskeletal: No acute or significant osseous findings.  IMPRESSION: 1. Negative for nephrolithiasis, hydronephrosis or ureteral stone 2. There are no acute intra-abdominal or pelvic abnormalities visualized   Electronically Signed   By: Jasmine PangKim  Fujinaga M.D.   On: 11/21/2017 20:31     Assessment &  Plan:   52 year old male with urinary retention and hematuria.  Urine culture was positive for coagulase-negative staph.  His catheter was removed and he had a successful voiding trial.  Rx tamsulosin was sent to his pharmacy.  He will return for cystoscopy.  He was instructed to call for symptoms of recurrent urinary retention.  He received Cipro 500 mg po prior to catheter removal.   Riki Altes, MD  [facility] [address] [place], [place] [address] [phone]

## 2018-01-10 ENCOUNTER — Ambulatory Visit (INDEPENDENT_AMBULATORY_CARE_PROVIDER_SITE_OTHER): Payer: BLUE CROSS/BLUE SHIELD | Admitting: Urology

## 2018-01-10 ENCOUNTER — Encounter: Payer: Self-pay | Admitting: Urology

## 2018-01-10 VITALS — BP 121/84 | HR 69 | Resp 16 | Ht 67.0 in | Wt 188.6 lb

## 2018-01-10 DIAGNOSIS — R310 Gross hematuria: Secondary | ICD-10-CM

## 2018-01-10 LAB — URINALYSIS, COMPLETE
Bilirubin, UA: NEGATIVE
Ketones, UA: NEGATIVE
LEUKOCYTES UA: NEGATIVE
Nitrite, UA: NEGATIVE
Protein, UA: NEGATIVE
RBC, UA: NEGATIVE
Specific Gravity, UA: 1.02 (ref 1.005–1.030)
UUROB: 0.2 mg/dL (ref 0.2–1.0)
pH, UA: 5 (ref 5.0–7.5)

## 2018-01-10 MED ORDER — LIDOCAINE HCL URETHRAL/MUCOSAL 2 % EX GEL
1.0000 "application " | Freq: Once | CUTANEOUS | Status: AC
  Administered 2018-01-10: 1 via URETHRAL

## 2018-01-10 MED ORDER — CIPROFLOXACIN HCL 500 MG PO TABS
500.0000 mg | ORAL_TABLET | Freq: Once | ORAL | Status: AC
Start: 2018-01-10 — End: 2018-01-10
  Administered 2018-01-10: 500 mg via ORAL

## 2018-01-10 NOTE — Progress Notes (Signed)
   01/10/18  CC: No chief complaint on file.   HPI: History of UTI with urinary retention and hematuria.  Successful voiding trial and he is presently asymptomatic.  Denies recurrent hematuria  Blood pressure 121/84, pulse 69, resp. rate 16, height  (1.702 m), weight 188 lb 9.6 oz (85.5 kg), SpO2 96 %. NED. A&Ox3.   No respiratory distress   Abd soft, NT, ND Normal phallus with bilateral descended testicles  Cystoscopy Procedure Note  Patient identification was confirmed, informed consent was obtained, and patient was prepped using Betadine solution.  Lidocaine jelly was administered per urethral meatus.    Preoperative abx where received prior to procedure.     Pre-Procedure: - Inspection reveals a normal caliber ureteral meatus.  Procedure: The flexible cystoscope was introduced without difficulty - No urethral strictures/lesions are present. - Mild to moderate lateral lobe enlargement prostate with proximally 3 cm prostatic urethral length - Normal bladder neck - Bilateral ureteral orifices identified - Bladder mucosa  reveals no ulcers, tumors, or lesions - No bladder stones - No trabeculation  Retroflexion shows no abnormalities or intravesical median lobe   Post-Procedure: - Patient tolerated the procedure well  Assessment/ Plan: Unremarkable cystoscopy.  Hematuria most likely secondary to UTI.  Instructed to call for recurrent hematuria or voiding symptoms.  Recheck urinalysis 6 months.

## 2018-01-11 ENCOUNTER — Encounter: Payer: Self-pay | Admitting: Urology

## 2018-02-26 IMAGING — DX DG CHEST 1V
1 series · 1 of 1 positions shown · non-contrast
Comparison: 02/14/2016

CLINICAL DATA: Upper abdominal pain x1 month, nausea

EXAM:
CHEST 1 VIEW

[chest ap]
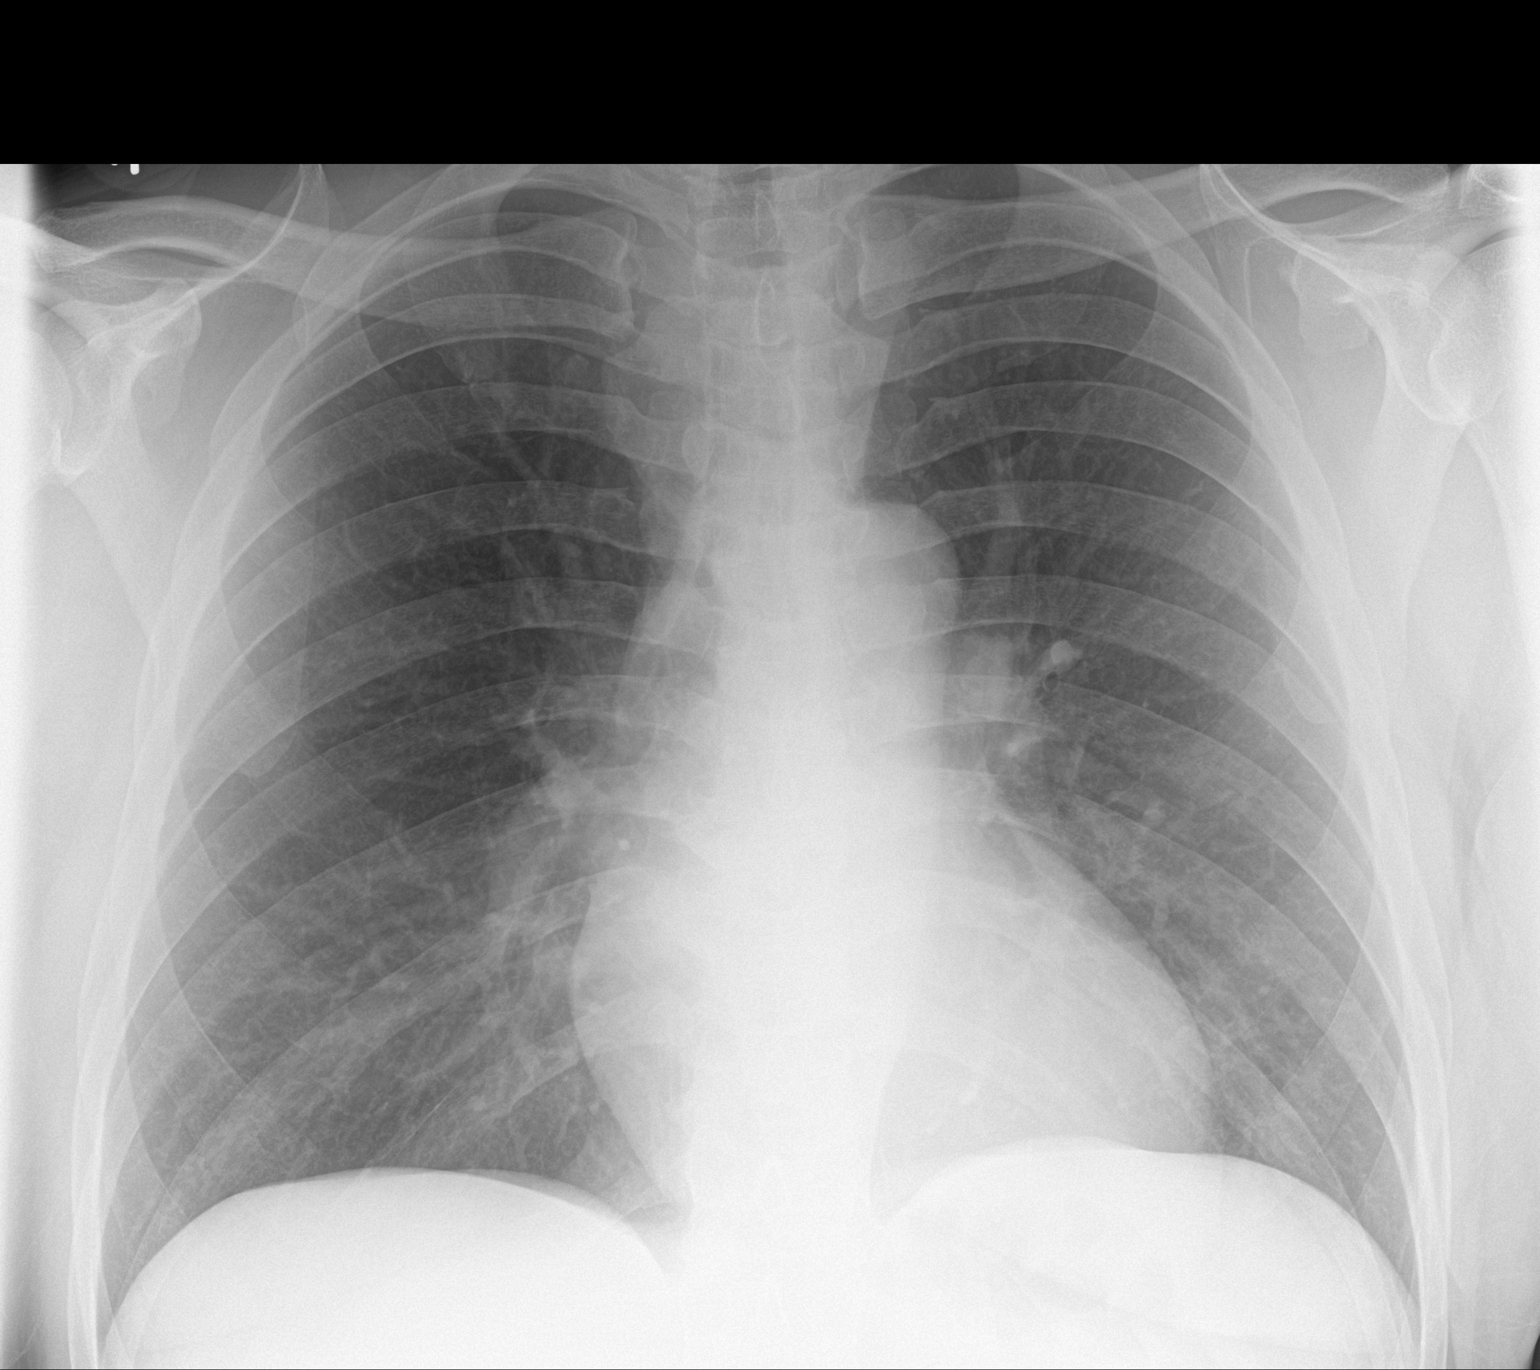

[1 of 1 positions shown; findings below may reference images not displayed]

FINDINGS: Lungs are clear.  No pleural effusion or pneumothorax.

The heart is normal in size.
IMPRESSION: No evidence of acute cardiopulmonary disease.

## 2018-03-30 ENCOUNTER — Other Ambulatory Visit: Payer: Self-pay

## 2018-03-30 ENCOUNTER — Encounter: Payer: Self-pay | Admitting: Emergency Medicine

## 2018-03-30 DIAGNOSIS — Z87891 Personal history of nicotine dependence: Secondary | ICD-10-CM | POA: Diagnosis not present

## 2018-03-30 DIAGNOSIS — Z794 Long term (current) use of insulin: Secondary | ICD-10-CM | POA: Insufficient documentation

## 2018-03-30 DIAGNOSIS — I10 Essential (primary) hypertension: Secondary | ICD-10-CM | POA: Diagnosis not present

## 2018-03-30 DIAGNOSIS — M542 Cervicalgia: Secondary | ICD-10-CM | POA: Diagnosis present

## 2018-03-30 DIAGNOSIS — Z79899 Other long term (current) drug therapy: Secondary | ICD-10-CM | POA: Insufficient documentation

## 2018-03-30 DIAGNOSIS — M436 Torticollis: Secondary | ICD-10-CM | POA: Insufficient documentation

## 2018-03-30 DIAGNOSIS — E119 Type 2 diabetes mellitus without complications: Secondary | ICD-10-CM | POA: Insufficient documentation

## 2018-03-30 NOTE — ED Notes (Signed)
Patient presents with right ear pain and facial swelling with lymph node enlargement x 2 days

## 2018-03-30 NOTE — ED Triage Notes (Signed)
Pt arrives ambulatory to triage c/o of right sided facial swelling which "has been bothering me the last 3 or four days". Pt reports that swelling is just below the right ear. Pt's face appears symmetrical at this time and pt is in NAD.

## 2018-03-31 ENCOUNTER — Emergency Department
Admission: EM | Admit: 2018-03-31 | Discharge: 2018-03-31 | Disposition: A | Discharge status: Home / Self Care | Payer: BLUE CROSS/BLUE SHIELD | Source: Home / Self Care | Attending: Emergency Medicine | Admitting: Emergency Medicine

## 2018-03-31 ENCOUNTER — Emergency Department
Admission: EM | Admit: 2018-03-31 | Discharge: 2018-03-31 | Disposition: A | Discharge status: Home / Self Care | Payer: BLUE CROSS/BLUE SHIELD | Attending: Emergency Medicine | Admitting: Emergency Medicine

## 2018-03-31 ENCOUNTER — Other Ambulatory Visit: Payer: Self-pay

## 2018-03-31 ENCOUNTER — Emergency Department: Payer: BLUE CROSS/BLUE SHIELD

## 2018-03-31 DIAGNOSIS — Z794 Long term (current) use of insulin: Secondary | ICD-10-CM | POA: Insufficient documentation

## 2018-03-31 DIAGNOSIS — E119 Type 2 diabetes mellitus without complications: Secondary | ICD-10-CM | POA: Insufficient documentation

## 2018-03-31 DIAGNOSIS — R1312 Dysphagia, oropharyngeal phase: Secondary | ICD-10-CM | POA: Insufficient documentation

## 2018-03-31 DIAGNOSIS — M436 Torticollis: Secondary | ICD-10-CM

## 2018-03-31 DIAGNOSIS — M542 Cervicalgia: Secondary | ICD-10-CM

## 2018-03-31 DIAGNOSIS — Z79899 Other long term (current) drug therapy: Secondary | ICD-10-CM | POA: Insufficient documentation

## 2018-03-31 DIAGNOSIS — Z87891 Personal history of nicotine dependence: Secondary | ICD-10-CM | POA: Insufficient documentation

## 2018-03-31 DIAGNOSIS — R221 Localized swelling, mass and lump, neck: Secondary | ICD-10-CM

## 2018-03-31 DIAGNOSIS — I10 Essential (primary) hypertension: Secondary | ICD-10-CM | POA: Insufficient documentation

## 2018-03-31 DIAGNOSIS — K118 Other diseases of salivary glands: Secondary | ICD-10-CM

## 2018-03-31 LAB — CBC WITH DIFFERENTIAL/PLATELET
BASOS PCT: 1 %
Basophils Absolute: 0 10*3/uL (ref 0–0.1)
EOS ABS: 0.1 10*3/uL (ref 0–0.7)
EOS PCT: 2 %
HCT: 40.6 % (ref 40.0–52.0)
Hemoglobin: 14.5 g/dL (ref 13.0–18.0)
LYMPHS ABS: 1.8 10*3/uL (ref 1.0–3.6)
Lymphocytes Relative: 27 %
MCH: 31.3 pg (ref 26.0–34.0)
MCHC: 35.7 g/dL (ref 32.0–36.0)
MCV: 87.5 fL (ref 80.0–100.0)
Monocytes Absolute: 0.7 10*3/uL (ref 0.2–1.0)
Monocytes Relative: 10 %
NEUTROS PCT: 60 %
Neutro Abs: 4.2 10*3/uL (ref 1.4–6.5)
PLATELETS: 181 10*3/uL (ref 150–440)
RBC: 4.64 MIL/uL (ref 4.40–5.90)
RDW: 13.7 % (ref 11.5–14.5)
WBC: 6.8 10*3/uL (ref 3.8–10.6)

## 2018-03-31 LAB — COMPREHENSIVE METABOLIC PANEL
ALK PHOS: 84 U/L (ref 38–126)
ALT: 24 U/L (ref 0–44)
AST: 24 U/L (ref 15–41)
Albumin: 4.1 g/dL (ref 3.5–5.0)
Anion gap: 6 (ref 5–15)
BILIRUBIN TOTAL: 0.8 mg/dL (ref 0.3–1.2)
BUN: 19 mg/dL (ref 6–20)
CALCIUM: 8.8 mg/dL — AB (ref 8.9–10.3)
CHLORIDE: 109 mmol/L (ref 98–111)
CO2: 25 mmol/L (ref 22–32)
CREATININE: 0.95 mg/dL (ref 0.61–1.24)
GFR calc non Af Amer: >60 mL/min (ref >60)
Glucose, Bld: 143 mg/dL — ABNORMAL HIGH (ref 70–99)
Potassium: 3.5 mmol/L (ref 3.5–5.1)
Sodium: 140 mmol/L (ref 135–145)
TOTAL PROTEIN: 7 g/dL (ref 6.5–8.1)

## 2018-03-31 LAB — LACTIC ACID, PLASMA: LACTIC ACID, VENOUS: 1.5 mmol/L (ref 0.5–1.9)

## 2018-03-31 MED ORDER — SODIUM CHLORIDE 0.9 % IV BOLUS
1000.0000 mL | Freq: Once | INTRAVENOUS | Status: AC
  Administered 2018-03-31: 1000 mL via INTRAVENOUS

## 2018-03-31 MED ORDER — CYCLOBENZAPRINE HCL 10 MG PO TABS: 10.0000 mg | ORAL_TABLET | Freq: Three times a day (TID) | ORAL | 0 refills | Status: DC | PRN

## 2018-03-31 MED ORDER — CYCLOBENZAPRINE HCL 10 MG PO TABS
10.0000 mg | ORAL_TABLET | Freq: Once | ORAL | Status: AC
  Administered 2018-03-31: 10 mg via ORAL
  Filled 2018-03-31: qty 1

## 2018-03-31 MED ORDER — IBUPROFEN 600 MG PO TABS
600.0000 mg | ORAL_TABLET | Freq: Once | ORAL | Status: AC
Start: 2018-03-31 — End: 2018-03-31
  Administered 2018-03-31: 600 mg via ORAL
  Filled 2018-03-31: qty 1

## 2018-03-31 MED ORDER — MELOXICAM 7.5 MG PO TABS
15.0000 mg | ORAL_TABLET | Freq: Once | ORAL | Status: AC
  Administered 2018-03-31: 15 mg via ORAL
  Filled 2018-03-31: qty 2

## 2018-03-31 MED ORDER — IOHEXOL 300 MG/ML  SOLN
75.0000 mL | Freq: Once | INTRAMUSCULAR | Status: AC | PRN
  Administered 2018-03-31: 75 mL via INTRAVENOUS
  Filled 2018-03-31: qty 75

## 2018-03-31 MED ORDER — MELOXICAM 15 MG PO TABS: 15.0000 mg | ORAL_TABLET | Freq: Every day | ORAL | 0 refills | Status: DC

## 2018-03-31 NOTE — ED Provider Notes (Signed)
[facility] Emergency Department Provider Note  ____________________________________________  Time seen: Approximately 7:54 PM  I have reviewed the triage vital signs and the nursing notes.   HISTORY  Chief Complaint Torticollis    HPI Miguel Walters is a 52 y.o. male who presents the emergency department for the second time within 24 hours for complaint of right-sided neck pain and swelling.  Patient reports that pain is progressively worsening.  Patient reports that "though does not look swollen the right side of my face/jaw is swollen."  Patient reports that he is now having difficulty swallowing.  No fevers or chills, nasal congestion, sore throat, cough.  Patient reports that the pain is mostly in the angle of the jaw, submandibular region, extending down his neck and into his shoulder.  Patient denies any headache, chest pain, shortness of breath, abdominal pain, nausea vomiting at this time.  Patient was seen in this department less than 24 hours ago, diagnosed with trapezius muscle strain/torticollis and advised to take NSAIDs.  Patient reports that NSAIDs have not alleviated his symptoms and they are worsening since he was seen earlier.    Past Medical History:  Diagnosis Date  . Anginal pain (HCC)   . Diabetes mellitus without complication (HCC)   . Elevated lipids   . H. pylori infection   . Heart murmur   . Hyperlipemia   . Hypertension   . Keloid   . Multiple gastric ulcers   . Murmur   . Sleep apnea     There are no active problems to display for this patient.   Past Surgical History:  Procedure Laterality Date  . COLONOSCOPY WITH PROPOFOL N/A 11/08/2017   Procedure: COLONOSCOPY WITH PROPOFOL;  Surgeon: Scot JunElliott, Robert T, MD;  Location: [facility] ENDOSCOPY;  Service: Endoscopy;  Laterality: N/A;  . FRACTURE SURGERY    . LEFT HEART CATH AND CORONARY ANGIOGRAPHY Left 10/10/2016   Procedure: Left Heart Cath and Coronary Angiography;  Surgeon:  Alwyn Peawayne D Callwood, MD;  Location: [facility] INVASIVE CV LAB;  Service: Cardiovascular;  Laterality: Left;    Prior to Admission medications   Medication Sig Start Date End Date Taking? Authorizing Provider  cyclobenzaprine (FLEXERIL) 10 MG tablet Take 1 tablet (10 mg total) by mouth 3 (three) times daily as needed for muscle spasms. 03/31/18   Hollee Fate, Delorise RoyalsJonathan D, PA-C  insulin detemir (LEVEMIR) 100 UNIT/ML injection Inject 80 Units into the skin 2 (two) times daily.     [provider]  JARDIANCE 10 MG TABS tablet Take 10 mg by mouth daily. 06/04/17   [provider]  meloxicam (MOBIC) 15 MG tablet Take 1 tablet (15 mg total) by mouth daily. 03/31/18   Kohei Antonellis, Delorise RoyalsJonathan D, PA-C  naproxen (NAPROSYN) 500 MG tablet Take 1 tablet by mouth 2 (two) times daily. 07/03/17   [provider]  omeprazole (PRILOSEC) 20 MG capsule Take 20 mg by mouth daily.    [provider]  SitaGLIPtin-MetFORMIN HCl (JANUMET XR) 50-1000 MG TB24 Take 2 tablets by mouth daily with supper.    [provider]  sucralfate (CARAFATE) 1 g tablet Take 1 tablet (1 g total) by mouth 4 (four) times daily. 07/16/16   Sharman CheekStafford, Phillip, MD    Allergies Codeine; Oxycodone; and Hydrocodone  No family history on file.  Social History Social History   Tobacco Use  . Smoking status: Former Smoker    Last attempt to quit: 02/23/2007    Years since quitting: 11.1  . Smokeless tobacco:  Never Used  Substance Use Topics  . Alcohol use: No  . Drug use: No     Review of Systems  Constitutional: No fever/chills Eyes: No visual changes. No discharge ENT: Positive for difficulty swallowing, right angle of the jaw/submandibular pain and reported swelling. Cardiovascular: no chest pain. Respiratory: no cough. No SOB. Gastrointestinal: No abdominal pain.  No nausea, no vomiting.  No diarrhea.  No constipation. Musculoskeletal: Negative for musculoskeletal pain. Skin: Negative for rash,  abrasions, lacerations, ecchymosis. Neurological: Negative for headaches, focal weakness or numbness. 10-point ROS otherwise negative.  ____________________________________________   PHYSICAL EXAM:  VITAL SIGNS: ED Triage Vitals [03/31/18 1910]  Enc Vitals Group     BP 121/76     Pulse Rate 65     Resp 18     Temp 98.7 F (37.1 C)     Temp Source Oral     SpO2 99 %     Weight      Height      Head Circumference      Peak Flow      Pain Score 10     Pain Loc      Pain Edu?      Excl. in GC?      Constitutional: Alert and oriented. Well appearing and in no acute distress. Eyes: Conjunctivae are normal. PERRL. EOMI. Head: Atraumatic. ENT:      Ears: EACs and TMs unremarkable bilaterally.      Nose: No congestion/rhinnorhea.      Mouth/Throat: Mucous membranes are moist.  Pharynx is not erythematous and nonedematous.  Uvula is midline.  Palpation along the right submandibular region, right angle of the jaw, reveals firmness, tenderness to palpation.  Palpable findings are in the distribution of salivary gland/parotid gland.  No other palpable abnormality to the face/neck region. Neck: No stridor.  No midline cervical spine tenderness to palpation.  Tenderness to palpation along the right trapezius and sternocleidomastoid muscle distribution. Hematological/Lymphatic/Immunilogical: No anterior cervical lymphadenopathy. Cardiovascular: Normal rate, regular rhythm. Normal S1 and S2.  Good peripheral circulation. Respiratory: Normal respiratory effort without tachypnea or retractions. Lungs CTAB. Good air entry to the bases with no decreased or absent breath sounds. Gastrointestinal: Bowel sounds 4 quadrants. Soft and nontender to palpation. No guarding or rigidity. No palpable masses. No distention. No CVA tenderness. Musculoskeletal: Full range of motion to all extremities. No gross deformities appreciated. Neurologic:  Normal speech and language. No gross focal neurologic  deficits are appreciated.  Skin:  Skin is warm, dry and intact. No rash noted. Psychiatric: Mood and affect are normal. Speech and behavior are normal. Patient exhibits appropriate insight and judgement.   ____________________________________________   LABS (all labs ordered are listed, but only abnormal results are displayed)  Labs Reviewed  COMPREHENSIVE METABOLIC PANEL - Abnormal; Notable for the following components:      Result Value   Glucose, Bld 143 (*)    Calcium 8.8 (*)    All other components within normal limits  CBC WITH DIFFERENTIAL/PLATELET  LACTIC ACID, PLASMA  LACTIC ACID, PLASMA   ____________________________________________  EKG   ____________________________________________  RADIOLOGY I personally viewed and evaluated these images as part of my medical decision making, as well as reviewing the written report by the radiologist.  Ct Soft Tissue Neck W Contrast  Result Date: 03/31/2018 CLINICAL DATA:  52 year old male with pain along the back of the scalp, trapezius. Palpable abnormality at the angle of the mandible with difficulty swallowing. Query parotitis. EXAM: CT NECK WITH  CONTRAST TECHNIQUE: Multidetector CT imaging of the neck was performed using the standard protocol following the bolus administration of intravenous contrast. CONTRAST:  75mL OMNIPAQUE IOHEXOL 300 MG/ML  SOLN COMPARISON:  Neck CT 04/17/2016. FINDINGS: Pharynx and larynx: The glottis is closed. The pharyngeal and laryngeal soft tissue contours are stable since 2017 and within normal limits. Negative parapharyngeal and retropharyngeal spaces. Salivary glands: Negative sublingual space. The submandibular glands appear stable, symmetric, and within normal limits. A palpable area of concern is marked along the mid right parotid space on series 3, image 43. This is much the same area that was marked on the 2017 scan. The right parotid gland appears stable since 2017 and within normal limits. The  right gland is symmetric to the left in size and enhancement. There is a slight lobulation to the gland in the marked area of clinical concern which is unchanged (series 3, image 43 today versus series 2, image 33 previously). Stable and negative left parotid. Thyroid: Stable and negative. Lymph nodes: Negative. No cervical lymphadenopathy. The bilateral lymph nodes are stable since 2017 and within normal limits. Vascular: The major vascular structures in the neck and at the skull base are patent. Limited intracranial: Negative. Visualized orbits: Negative. Mastoids and visualized paranasal sinuses: Mild mucosal thickening in the left frontal sinus and right maxillary. No sinus fluid levels or bubbly opacity. Otherwise clear, including both tympanic cavities and mastoids. Skeleton: No acute osseous abnormality identified. Anterior lower cervical spine endplate spurring. No superficial soft tissue inflammation identified. The suboccipital and trapezius musculature appears symmetric and within normal limits. Upper chest: Negative visible superior mediastinum. Normal lung apices. IMPRESSION: 1. Stable and negative CT appearance of the neck since 2017. 2. The marked area of clinical concern corresponds to the right parotid gland, which appears stable and within normal limits. 3. No cellulitis or muscle abnormality identified in the neck. Electronically Signed   By: Odessa FlemingH  Hall M.D.   On: 03/31/2018 21:14    ____________________________________________    PROCEDURES  Procedure(s) performed:    Procedures    Medications  meloxicam (MOBIC) tablet 15 mg (has no administration in time range)  cyclobenzaprine (FLEXERIL) tablet 10 mg (has no administration in time range)  sodium chloride 0.9 % bolus 1,000 mL (0 mLs Intravenous Stopped 03/31/18 2134)  iohexol (OMNIPAQUE) 300 MG/ML solution 75 mL (75 mLs Intravenous Contrast Given 03/31/18 2034)     ____________________________________________   INITIAL  IMPRESSION / ASSESSMENT AND PLAN / ED COURSE  Pertinent labs & imaging results that were available during my care of the patient were reviewed by me and considered in my medical decision making (see chart for details).  Review of the [place] CSRS was performed in accordance of the [facility] prior to dispensing any controlled drugs.      Patient's diagnosis is consistent with neck pain.  Patient presents emergency department complaining of pain and swelling.  On exam, patient had a palpable enlargement around the parotid gland.  Patient was evaluated with labs and CT scan.  No appreciable signs of infection.  Parotid gland with slight lobulation on CT but no signs of infection.  Discussed findings with patient who verbalizes understanding of same.  I will treat him with anti-inflammatories and a muscle relaxer for his complaint of trapezius and sternocleidomastoid muscle tenderness.  Patient is agreeable with this plan.  He will follow-up with ENT if symptoms persist..  Patient is discharged with prescriptions for meloxicam and Flexeril.  Patient is given ED precautions to return  to the ED for any worsening or new symptoms.     ____________________________________________  FINAL CLINICAL IMPRESSION(S) / ED DIAGNOSES  Final diagnoses:  Neck pain on right side  Parotid gland pain      NEW MEDICATIONS STARTED DURING THIS VISIT:  ED Discharge Orders         Ordered    meloxicam (MOBIC) 15 MG tablet  Daily     03/31/18 2150    cyclobenzaprine (FLEXERIL) 10 MG tablet  3 times daily PRN     03/31/18 2150              This chart was dictated using voice recognition software/Dragon. Despite best efforts to proofread, errors can occur which can change the meaning. Any change was purely unintentional.    Racheal Patches, PA-C 03/31/18 2154    Myrna Blazer, MD 03/31/18 2352

## 2018-03-31 NOTE — ED Provider Notes (Signed)
[facility] Emergency Department Provider Note  ____________________________________________   I have reviewed the triage vital signs and the nursing notes. Where available I have reviewed prior notes and, if possible and indicated, outside hospital notes.    HISTORY  Chief Complaint Facial Swelling    HPI Miguel Walters is a 52 y.o. male  Patient presents today complaining of pain in his trapezius muscle up into the back of his scalp for 3 or 4 days.  It is uncomfortable when he lies on it the wrong way.  Worse when he touches it.  No chest pain no shortness of breath no exertional symptoms has not taken anything for the pain.  Worse when he changes position of his neck.  Did lift some heavy items prior to it starting.  Lawnmower.  Has had no numbness no weakness, he states that it hurts when he touches it.  He does not complain of facial swelling despite the triage note.  He has not noticed any lymph nodes.  He has no otalgia.  He states that he has had no fall or injury no headache no other complaints.    Past Medical History:  Diagnosis Date  . Anginal pain (HCC)   . Diabetes mellitus without complication (HCC)   . Elevated lipids   . H. pylori infection   . Heart murmur   . Hyperlipemia   . Hypertension   . Keloid   . Multiple gastric ulcers   . Murmur   . Sleep apnea     There are no active problems to display for this patient.   Past Surgical History:  Procedure Laterality Date  . COLONOSCOPY WITH PROPOFOL N/A 11/08/2017   Procedure: COLONOSCOPY WITH PROPOFOL;  Surgeon: Scot JunElliott, Robert T, MD;  [facility];  Service: Endoscopy;  Laterality: N/A;  . FRACTURE SURGERY    . LEFT HEART CATH AND CORONARY ANGIOGRAPHY Left 10/10/2016   Procedure: Left Heart Cath and Coronary Angiography;  Surgeon: Alwyn Peawayne D Callwood, MD;  Location: [facility] INVASIVE CV LAB;  Service: Cardiovascular;  Laterality: Left;    Prior to Admission medications    Medication Sig Start Date End Date Taking? Authorizing Provider  insulin detemir (LEVEMIR) 100 UNIT/ML injection Inject 80 Units into the skin 2 (two) times daily.     [provider]  JARDIANCE 10 MG TABS tablet Take 10 mg by mouth daily. 06/04/17   [provider]  naproxen (NAPROSYN) 500 MG tablet Take 1 tablet by mouth 2 (two) times daily. 07/03/17   [provider]  omeprazole (PRILOSEC) 20 MG capsule Take 20 mg by mouth daily.    [provider]  SitaGLIPtin-MetFORMIN HCl (JANUMET XR) 50-1000 MG TB24 Take 2 tablets by mouth daily with supper.    [provider]  sucralfate (CARAFATE) 1 g tablet Take 1 tablet (1 g total) by mouth 4 (four) times daily. 07/16/16   Sharman CheekStafford, Phillip, MD    Allergies Codeine; Oxycodone; and Hydrocodone  No family history on file.  Social History Social History   Tobacco Use  . Smoking status: Former Smoker    Last attempt to quit: 02/23/2007    Years since quitting: 11.1  . Smokeless tobacco: Never Used  Substance Use Topics  . Alcohol use: No  . Drug use: No    Review of Systems Constitutional: No fever/chills Eyes: No visual changes. ENT: No sore throat. No stiff neck no neck pain Cardiovascular: Denies chest pain. Respiratory: Denies shortness of breath. Gastrointestinal:  no vomiting.  No diarrhea.  No constipation. Genitourinary: Negative for dysuria. Musculoskeletal: Negative lower extremity swelling Skin: Negative for rash. Neurological: Negative for severe headaches, focal weakness or numbness.   ____________________________________________   PHYSICAL EXAM:  VITAL SIGNS: ED Triage Vitals [03/30/18 2248]  Enc Vitals Group     BP 133/78     Pulse Rate 66     Resp 18     Temp 98.4 F (36.9 C)     Temp Source Oral     SpO2 97 %     Weight 200 lb (90.7 kg)     Height 5\' 7"  (1.702 m)     Head Circumference      Peak Flow      Pain Score 9     Pain Loc      Pain Edu?       Excl. in GC?     Constitutional: Alert and oriented. Well appearing and in no acute distress.  She was asleep when I entered the room. Eyes: Conjunctivae are normal Head: Atraumatic HEENT: No congestion/rhinnorhea. Mucous membranes are moist.  Oropharynx non-erythematous Neck:   Tender to palpation in the trapezius muscle when I touch his trapezius muscle or squeeze that he states "ouch that the pain right there" and pulls back.  He has pain along the insertion of the trapezius muscle and along the belly of it towards the shoulder.  No shoulder tenderness.  He also has tenderness to palpation in the rhomboid and in the muscles of the upper back.  All of this reproduces pain.  Also hurts when he changes position of his neck from left to right but he has no mid meningismus that he can range his neck up and down without discomfort.  There are no lesions noted, shingles or otherwise,, no masses, no stridor Cardiovascular: Normal rate, regular rhythm. Grossly normal heart sounds.  Good peripheral circulation. Respiratory: Normal respiratory effort.  No retractions. Lungs CTAB. Abdominal: Soft and nontender. No distention. No guarding no rebound Back:  There is no focal tenderness or step off.  there is no midline tenderness there are no lesions noted. there is no CVA tenderness Musculoskeletal: No lower extremity tenderness, no upper extremity tenderness. No joint effusions, no DVT signs strong distal pulses no edema Neurologic:  Normal speech and language. No gross focal neurologic deficits are appreciated.  Skin:  Skin is warm, dry and intact. No rash noted. Psychiatric: Mood and affect are normal. Speech and behavior are normal.  ____________________________________________   LABS (all labs ordered are listed, but only abnormal results are displayed)  Labs Reviewed - No data to display  Pertinent labs  results that were available during my care of the patient were reviewed by me and considered  in my medical decision making (see chart for details). ____________________________________________  EKG  I personally interpreted any EKGs ordered by me or triage  ____________________________________________  RADIOLOGY  Pertinent labs & imaging results that were available during my care of the patient were reviewed by me and considered in my medical decision making (see chart for details). If possible, patient and/or family made aware of any abnormal findings.  No results found. ____________________________________________    PROCEDURES  Procedure(s) performed: None  Procedures  Critical Care performed: None  ____________________________________________   INITIAL IMPRESSION / ASSESSMENT AND PLAN / ED COURSE  Pertinent labs & imaging results that were available during my care of the patient were reviewed by me and considered in my medical decision making (  see chart for details).  Patient with multiple medical problems presents with what appears to be pretty obvious strain to his trapezius muscle all along it.  Very reproducible.  I do not think there is any evidence of acute radiculopathy certainly no weakness or numbness is elicited, TMs are normal bilaterally, I do not detect any significant lymphadenopathy no evidence of ongoing infection, I do not think this represents a dissection, gradual onset very reproducible discomfort in the neck, I do not think this represents CVA, I do not think this represents head bleed, I do not think this represents ACS PE or dissection, I have considered multiple different diagnoses for this patient but really he has very reproducible neck discomfort in the exact distribution of a certain muscle group after lifting something heavy.  Advised nonsteroidal pain medications, close follow-up return precautions given and understood.    ____________________________________________   FINAL CLINICAL IMPRESSION(S) / ED DIAGNOSES  Final diagnoses:   None      This chart was dictated using voice recognition software.  Despite best efforts to proofread,  errors can occur which can change meaning.      Jeanmarie PlantMcShane, Osmel Dykstra A, MD 03/31/18 [phone]

## 2018-03-31 NOTE — Discharge Instructions (Addendum)
This time, it appears most likely that you have strained your trapezius muscle which is causing this discomfort.  If you have any chest pain shortness of breath, severe headache numbness or weakness or any other new or worrisome symptoms return to the emergency room.  Take ibuprofen for the next 2 to 3 days as directed with food to see if it helps.  If you feel worse in any way come back.  Follow closely with your primary care doctor Monday.

## 2018-03-31 NOTE — ED Triage Notes (Signed)
Patient reports having neck pain and facial swelling.  No obvious swelling noted at this time.

## 2018-07-17 ENCOUNTER — Other Ambulatory Visit: Payer: Self-pay

## 2018-07-17 DIAGNOSIS — R310 Gross hematuria: Secondary | ICD-10-CM

## 2018-07-18 ENCOUNTER — Other Ambulatory Visit: Payer: BLUE CROSS/BLUE SHIELD

## 2018-07-18 ENCOUNTER — Encounter: Payer: Self-pay | Admitting: Urology

## 2019-05-09 ENCOUNTER — Ambulatory Visit: Payer: Self-pay

## 2019-05-09 ENCOUNTER — Other Ambulatory Visit: Payer: Self-pay

## 2019-05-09 VITALS — BP 148/82 | HR 86 | Ht 67.0 in | Wt 178.0 lb

## 2019-05-09 DIAGNOSIS — Z23 Encounter for immunization: Secondary | ICD-10-CM

## 2019-05-09 DIAGNOSIS — Z008 Encounter for other general examination: Secondary | ICD-10-CM

## 2019-05-09 LAB — POCT LIPID PANEL
HDL: 49
LDL: 48
Non-HDL: 63
POC Glucose: 287 mg/dl — AB (ref 70–99)
TC/HDL: 2.3
TC: 112
TRG: 76

## 2019-05-09 NOTE — Patient Instructions (Signed)
Preventing Influenza, Adult Influenza, more commonly known as "the flu," is a viral infection that mainly affects the respiratory tract. The respiratory tract includes structures that help you breathe, such as the lungs, nose, and throat. The flu causes many common cold symptoms, as well as a high fever and body aches. The flu spreads easily from person to person (is contagious). The flu is most common from December through March. This is called flu season.You can catch the flu virus by:  Breathing in droplets from an infected person's cough or sneeze.  Touching something that was recently contaminated with the virus and then touching your mouth, nose, or eyes. What can I do to lower my risk?        You can decrease your risk of getting the flu by:  Getting a flu shot (influenza vaccination) every year. This is the best way to prevent the flu. A flu shot is recommended for everyone age 6 months and older. ? It is best to get a flu shot in the fall, as soon as it is available. Getting a flu shot during winter or spring instead is still a good idea. Flu season can last into early spring. ? Preventing the flu through vaccination requires getting a new flu shot every year. This is because the flu virus changes slightly (mutates) from one year to the next. Even if a flu shot does not completely protect you from all flu virus mutations, it can reduce the severity of your illness and prevent dangerous complications of the flu. ? If you are pregnant, you can and should get a flu shot. ? If you have had a reaction to the shot in the past or if you are allergic to eggs, check with your health care provider before getting a flu shot. ? Sometimes the vaccine is available as a nasal spray. In some years, the nasal spray has not been as effective against the flu virus. Check with your health care provider if you have questions about this.  Practicing good health habits. This is especially important during  flu season. ? Avoid contact with people who are sick with flu or cold symptoms. ? Wash your hands with soap and water often. If soap and water are not available, use alcohol-based hand sanitizer. ? Avoid touching your hands to your face, especially when you have not washed your hands recently. ? Use a disinfectant to clean surfaces at home and at work that may be contaminated with the flu virus. ? Keep your body's disease-fighting system (immune system) in good shape by eating a healthy diet, drinking plenty of fluids, getting enough sleep, and exercising regularly. If you do get the flu, avoid spreading it to others by:  Staying home until your symptoms have been gone for at least one day.  Covering your mouth and nose when you cough or sneeze.  Avoiding close contact with others, especially babies and elderly people. Why are these changes important? Getting a flu shot and practicing good health habits protects you as well as other people. If you get the flu, your friends, family, and co-workers are also at risk of getting it, because it spreads so easily to others. Each year, about 2 out of every 10 people get the flu. Having the flu can lead to complications, such as pneumonia, ear infection, and sinus infection. The flu also can be deadly, especially for babies, people older than age 65, and people who have serious long-term diseases. How is this treated? Most   people recover from the flu by resting at home and drinking plenty of fluids. However, a prescription antiviral medicine may reduce your flu symptoms and may make your flu go away sooner. This medicine must be started within a few days of getting flu symptoms. You can talk with your health care provider about whether you need an antiviral medicine. Antiviral medicine may be prescribed for people who are at risk for more serious flu symptoms. This includes people who:  Are older than age 65.  Are pregnant.  Have a condition that  makes the flu worse or more dangerous. Where to find more information  Centers for Disease Control and Prevention: www.cdc.gov/flu/index.htm  Flu.gov: www.flu.gov/prevention-vaccination  American Academy of Family [employer]icians: familydoctor.org/familydoctor/en/kids/vaccines/preventing-the-flu.html Contact a health care provider if:  You have influenza and you develop new symptoms.  You have: ? Chest pain. ? Diarrhea. ? A fever.  Your cough gets worse, or you produce more mucus. Summary  The best way to prevent the flu is to get a flu shot every year in the fall.  Even if you get the flu after you have received the yearly vaccine, your flu may be milder and go away sooner because of your flu shot.  If you get the flu, antiviral medicines that are started with a few days of symptoms may reduce your flu symptoms and may make your flu go away sooner.  You can also help prevent the flu by practicing good health habits. This information is not intended to replace advice given to you by your health care provider. Make sure you discuss any questions you have with your health care provider. Document Released: 08/16/2015 Document Revised: 07/14/2017 Document Reviewed: 04/09/2016 Elsevier Patient Education  2020 Elsevier Inc. Influenza (Flu) Vaccine (Inactivated or Recombinant): What You Need to Know 1. Why get vaccinated? Influenza vaccine can prevent influenza (flu). Flu is a contagious disease that spreads around the [place] every year, usually between October and May. Anyone can get the flu, but it is more dangerous for some people. Infants and young children, people 65 years of age and older, pregnant women, and people with certain health conditions or a weakened immune system are at greatest risk of flu complications. Pneumonia, bronchitis, sinus infections and ear infections are examples of flu-related complications. If you have a medical condition, such as heart disease, cancer or  diabetes, flu can make it worse. Flu can cause fever and chills, sore throat, muscle aches, fatigue, cough, headache, and runny or stuffy nose. Some people may have vomiting and diarrhea, though this is more common in children than adults. Each year thousands of people in the [place] die from flu, and many more are hospitalized. Flu vaccine prevents millions of illnesses and flu-related visits to the doctor each year. 2. Influenza vaccine CDC recommends everyone 6 months of age and older get vaccinated every flu season. Children 6 months through 8 years of age may need 2 doses during a single flu season. Everyone else needs only 1 dose each flu season. It takes about 2 weeks for protection to develop after vaccination. There are many flu viruses, and they are always changing. Each year a new flu vaccine is made to protect against three or four viruses that are likely to cause disease in the upcoming flu season. Even when the vaccine doesn't exactly match these viruses, it may still provide some protection. Influenza vaccine does not cause flu. Influenza vaccine may be given at the same time as other vaccines. 3.   Talk with your health care provider Tell your vaccine provider if the person getting the vaccine:  Has had an allergic reaction after a previous dose of influenza vaccine, or has any severe, life-threatening allergies.  Has ever had Guillain-Barr Syndrome (also called GBS). In some cases, your health care provider may decide to postpone influenza vaccination to a future visit. People with minor illnesses, such as a cold, may be vaccinated. People who are moderately or severely ill should usually wait until they recover before getting influenza vaccine. Your health care provider can give you more information. 4. Risks of a vaccine reaction  Soreness, redness, and swelling where shot is given, fever, muscle aches, and headache can happen after influenza vaccine.  There may be a  very small increased risk of Guillain-Barr Syndrome (GBS) after inactivated influenza vaccine (the flu shot). Young children who get the flu shot along with pneumococcal vaccine (PCV13), and/or DTaP vaccine at the same time might be slightly more likely to have a seizure caused by fever. Tell your health care provider if a child who is getting flu vaccine has ever had a seizure. People sometimes faint after medical procedures, including vaccination. Tell your provider if you feel dizzy or have vision changes or ringing in the ears. As with any medicine, there is a very remote chance of a vaccine causing a severe allergic reaction, other serious injury, or death. 5. What if there is a serious problem? An allergic reaction could occur after the vaccinated person leaves the clinic. If you see signs of a severe allergic reaction (hives, swelling of the face and throat, difficulty breathing, a fast heartbeat, dizziness, or weakness), call 9-1-1 and get the person to the nearest hospital. For other signs that concern you, call your health care provider. Adverse reactions should be reported to the Vaccine Adverse Event Reporting System (VAERS). Your health care provider will usually file this report, or you can do it yourself. Visit the VAERS website at www.vaers.hhs.gov or call 1-800-822-7967.VAERS is only for reporting reactions, and VAERS staff do not give medical advice. 6. The National Vaccine Injury Compensation Program The National Vaccine Injury Compensation Program (VICP) is a federal program that was created to compensate people who may have been injured by certain vaccines. Visit the VICP website at www.hrsa.gov/vaccinecompensation or call 1-800-338-2382 to learn about the program and about filing a claim. There is a time limit to file a claim for compensation. 7. How can I learn more?  Ask your healthcare provider.  Call your local or state health department.  Contact the Centers for Disease  Control and Prevention (CDC): ? Call 1-800-232-4636 (1-800-CDC-INFO) or ? Visit CDC's www.cdc.gov/flu Vaccine Information Statement (Interim) Inactivated Influenza Vaccine (03/29/2018) This information is not intended to replace advice given to you by your health care provider. Make sure you discuss any questions you have with your health care provider. Document Released: 05/26/2006 Document Revised: 11/20/2018 Document Reviewed: 04/02/2018 Elsevier Patient Education  2020 Elsevier Inc.  

## 2019-05-09 NOTE — Progress Notes (Signed)
     Patient ID: Miguel Walters, male    DOB: [birth year], 53 y.o.   MRN: [id number]    Thank you!!  Apolonio Schneiders RN  [facility] [place]: [phone]  Cell:  [phone] Website: [email or web address]

## 2019-05-30 IMAGING — NM NM GASTRIC EMPTYING
1 series · 10 of 10 positions shown · non-contrast
Comparison: None.

CLINICAL DATA: Abdominal pain for 2 months

EXAM:
NUCLEAR MEDICINE GASTRIC EMPTYING SCAN
TECHNIQUE: After oral ingestion of radiolabeled meal, sequential abdominal
images were obtained for 4 hours. Percentage of activity emptying
the stomach was calculated at 1 hour, 2 hour, 3 hour, and 4 hours.
RADIOPHARMACEUTICALS:  2.22 mCi Qc-JJm sulfur colloid in
standardized meal

[Series 1000: gatric statics (results) · 3.90mm/px · 5 acquisitions, 10 frames shown]
[im 1/5]
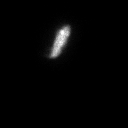
[im 1/5]
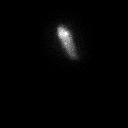
[im 2/5]
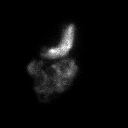
[im 2/5]
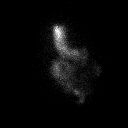
[im 3/5]
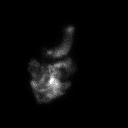
[im 3/5]
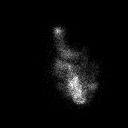
[im 4/5]
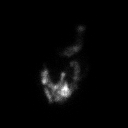
[im 4/5]
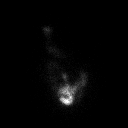
[im 5/5]
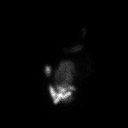
[im 5/5]
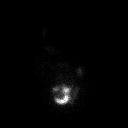

[10 of 10 positions shown; findings below may reference images not displayed]

FINDINGS: Expected location of the stomach in the left upper quadrant.
Ingested meal empties the stomach gradually over the course of the
study.

45% emptied at 1 hr ( normal >= 10%)

79% emptied at 2 hr ( normal >= 40%)

87% emptied at 3 hr ( normal >= 70%)

95% emptied at 4 hr ( normal >= 90%)
IMPRESSION: Normal gastric emptying study.

## 2019-07-14 ENCOUNTER — Emergency Department
Admission: EM | Admit: 2019-07-14 | Discharge: 2019-07-14 | Disposition: A | Discharge status: Home / Self Care | Payer: Self-pay | Attending: Emergency Medicine | Admitting: Emergency Medicine

## 2019-07-14 ENCOUNTER — Encounter: Payer: Self-pay | Admitting: Emergency Medicine

## 2019-07-14 ENCOUNTER — Other Ambulatory Visit: Payer: Self-pay

## 2019-07-14 DIAGNOSIS — M549 Dorsalgia, unspecified: Secondary | ICD-10-CM | POA: Insufficient documentation

## 2019-07-14 DIAGNOSIS — Z5321 Procedure and treatment not carried out due to patient leaving prior to being seen by health care provider: Secondary | ICD-10-CM | POA: Insufficient documentation

## 2019-07-14 LAB — COMPREHENSIVE METABOLIC PANEL
ALT: 20 U/L (ref 0–44)
AST: 17 U/L (ref 15–41)
Albumin: 4.4 g/dL (ref 3.5–5.0)
Alkaline Phosphatase: 98 U/L (ref 38–126)
Anion gap: 12 (ref 5–15)
BUN: 9 mg/dL (ref 6–20)
CO2: 22 mmol/L (ref 22–32)
Calcium: 9.3 mg/dL (ref 8.9–10.3)
Chloride: 102 mmol/L (ref 98–111)
Creatinine, Ser: 0.74 mg/dL (ref 0.61–1.24)
GFR calc Af Amer: >60 mL/min (ref >60)
GFR calc non Af Amer: >60 mL/min (ref >60)
Glucose, Bld: 168 mg/dL — ABNORMAL HIGH (ref 70–99)
Potassium: 3.6 mmol/L (ref 3.5–5.1)
Sodium: 136 mmol/L (ref 135–145)
Total Bilirubin: 0.7 mg/dL (ref 0.3–1.2)
Total Protein: 7.8 g/dL (ref 6.5–8.1)

## 2019-07-14 LAB — CBC
HCT: 41.1 % (ref 39.0–52.0)
Hemoglobin: 14.3 g/dL (ref 13.0–17.0)
MCH: 29.4 pg (ref 26.0–34.0)
MCHC: 34.8 g/dL (ref 30.0–36.0)
MCV: 84.4 fL (ref 80.0–100.0)
Platelets: 260 10*3/uL (ref 150–400)
RBC: 4.87 MIL/uL (ref 4.22–5.81)
RDW: 13.2 % (ref 11.5–15.5)
WBC: 9.2 10*3/uL (ref 4.0–10.5)
nRBC: 0 % (ref 0.0–0.2)

## 2019-07-14 LAB — URINALYSIS, ROUTINE W REFLEX MICROSCOPIC
Bacteria, UA: NONE SEEN
Bilirubin Urine: NEGATIVE
Glucose, UA: >=500 mg/dL — AB
Hgb urine dipstick: NEGATIVE
Ketones, ur: 5 mg/dL — AB
Leukocytes,Ua: NEGATIVE
Nitrite: NEGATIVE
Protein, ur: NEGATIVE mg/dL
Specific Gravity, Urine: 1.032 — ABNORMAL HIGH (ref 1.005–1.030)
WBC, UA: NONE SEEN WBC/hpf (ref 0–5)
pH: 5 (ref 5.0–8.0)

## 2019-07-14 NOTE — ED Triage Notes (Addendum)
Pt reports left mid back pain x 1 week; pain with rest and with movement; denies urinary s/s; denies fever; pt denies injury; history of diabetes and is concerned about his kidneys; pt has not taken his PRN flexeril or naproxen today to see if it would help his pain;

## 2019-07-15 ENCOUNTER — Other Ambulatory Visit: Payer: Self-pay | Admitting: Urology

## 2019-07-15 ENCOUNTER — Telehealth: Payer: Self-pay | Admitting: Emergency Medicine

## 2019-07-15 DIAGNOSIS — R310 Gross hematuria: Secondary | ICD-10-CM

## 2019-07-15 NOTE — Telephone Encounter (Signed)
Called patient due to lwot to inquire about condition and follow up plans. Says pain got better after taking muscle relaxer.  I told him he is spilling sugar in his urine and asked him to inform his doctor of that and that there are labs to be reviewed.

## 2019-08-11 IMAGING — CT CT RENAL STONE PROTOCOL
2 of 4 series · 17 of 46 positions shown, 19 images · non-contrast
Comparison: 04/10/2016 CT

CLINICAL DATA: Hematuria

EXAM:
CT ABDOMEN AND PELVIS WITHOUT CONTRAST
TECHNIQUE: Multidetector CT imaging of the abdomen and pelvis was performed
following the standard protocol without IV contrast.

[Series 2: stone full standard · axial · 0.82mm/px · z∈[-549,-139]mm · 14 of 90 slices shown, 16 images]
[im 4/90  soft-tissue]
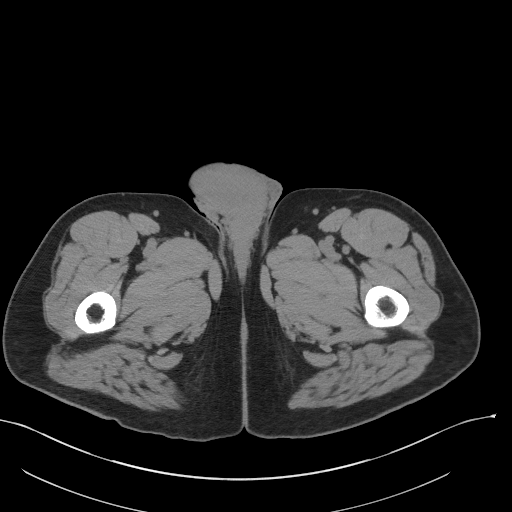
[im 4/90  bone]
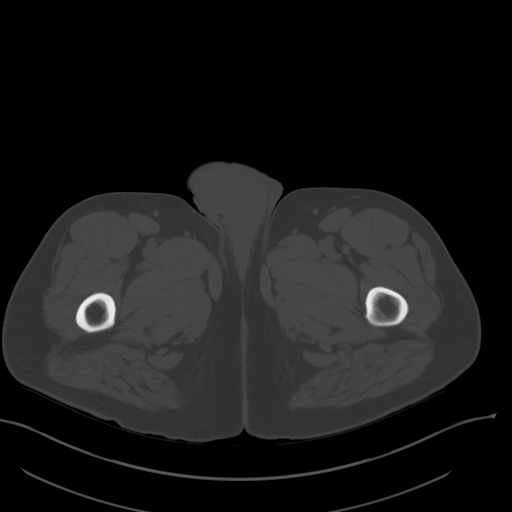
[im 11/90  soft-tissue]
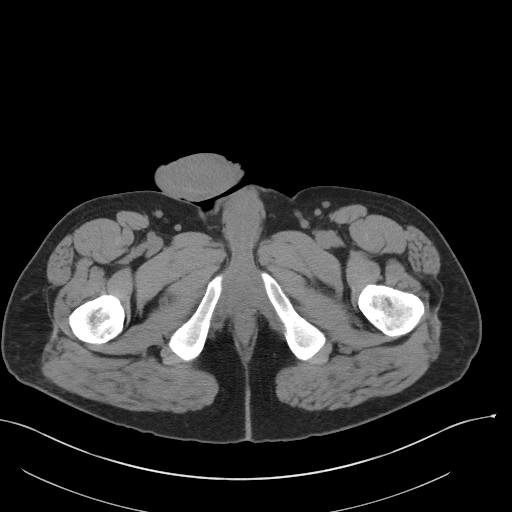
[im 18/90  soft-tissue]
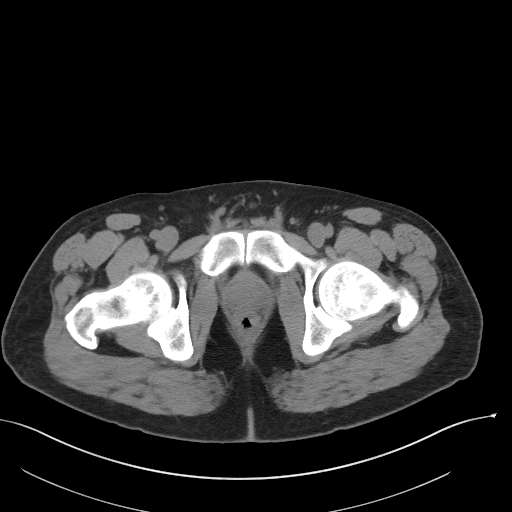
[im 24/90  soft-tissue]
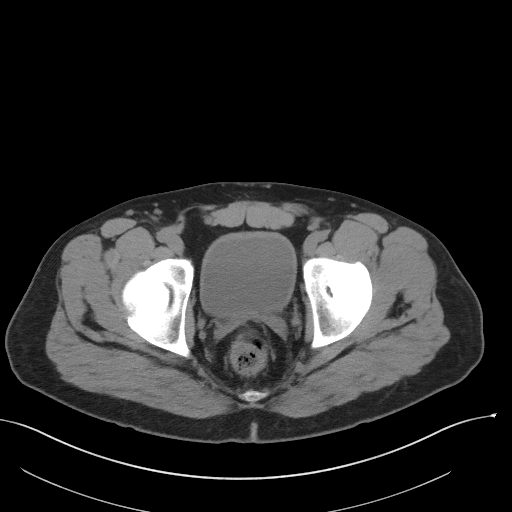
[im 31/90  soft-tissue]
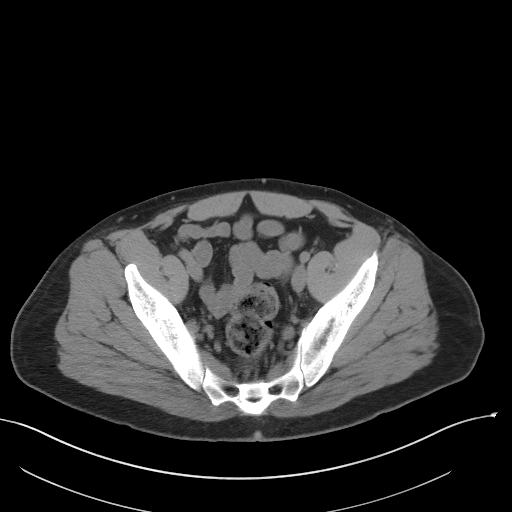
[im 35/90  soft-tissue]
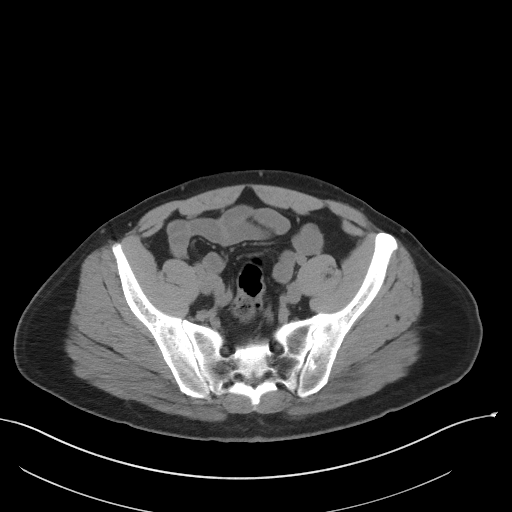
[im 42/90  soft-tissue]
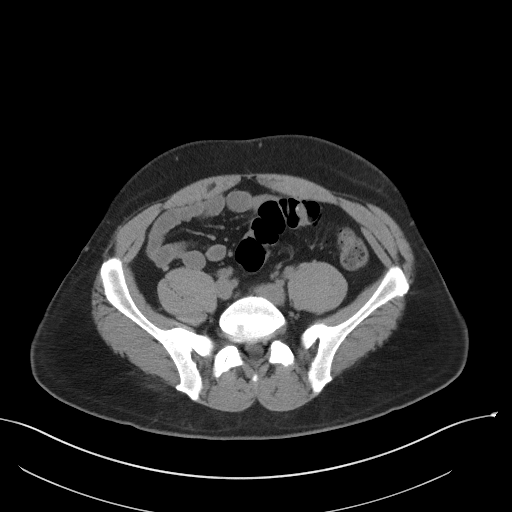
[im 48/90  soft-tissue]
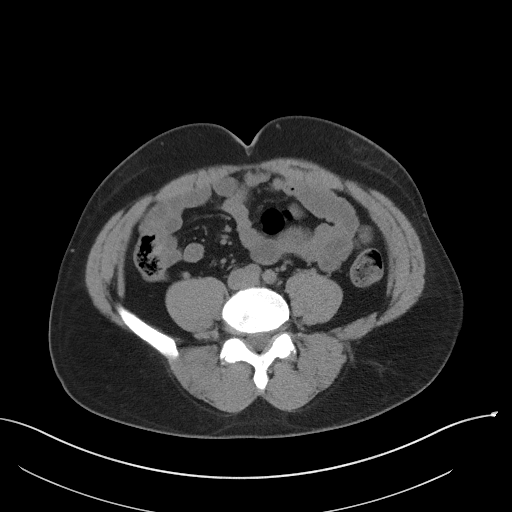
[im 55/90  soft-tissue]
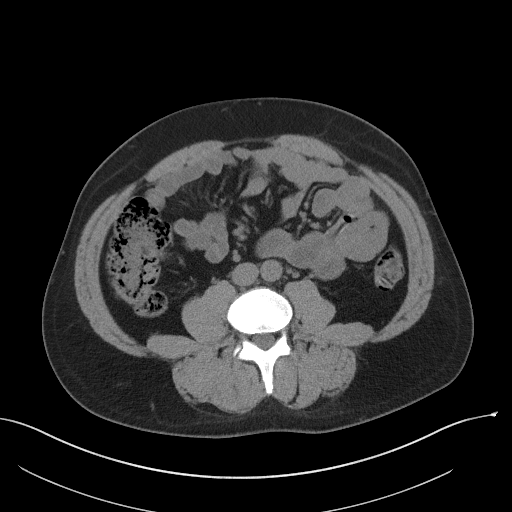
[im 55/90  bone]
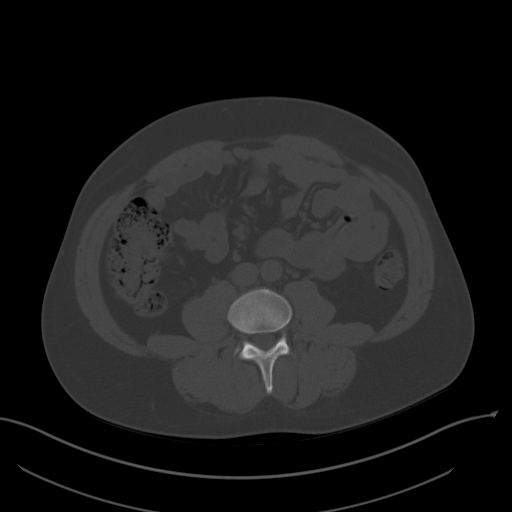
[im 59/90  soft-tissue]
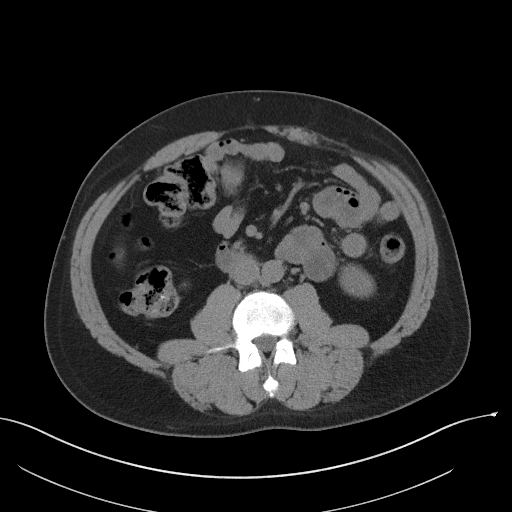
[im 66/90  soft-tissue]
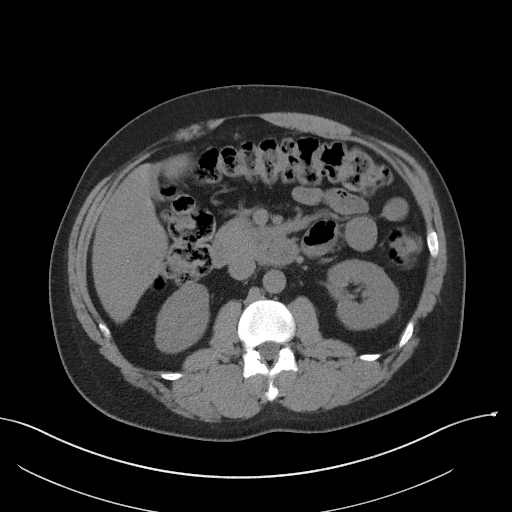
[im 72/90  soft-tissue]
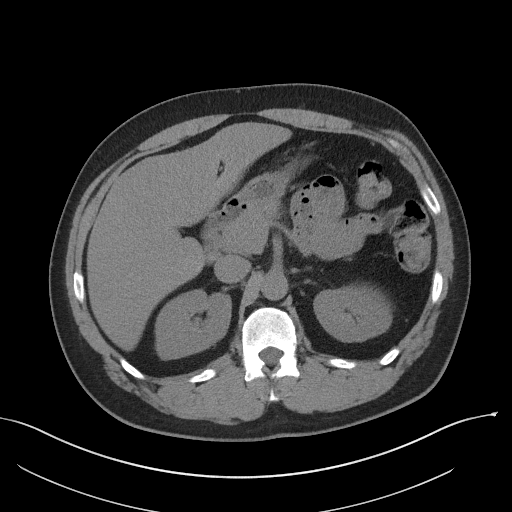
[im 79/90  soft-tissue]
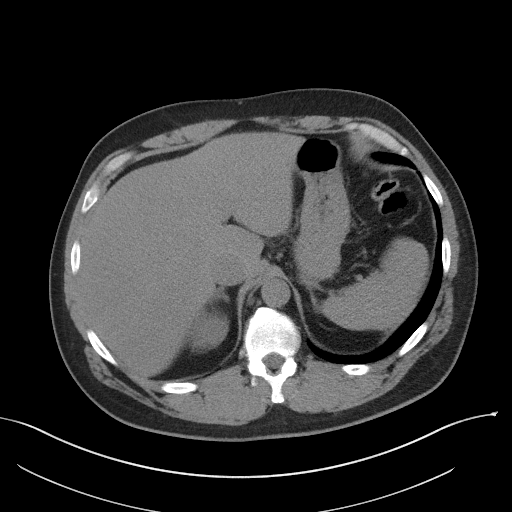
[im 86/90  soft-tissue]
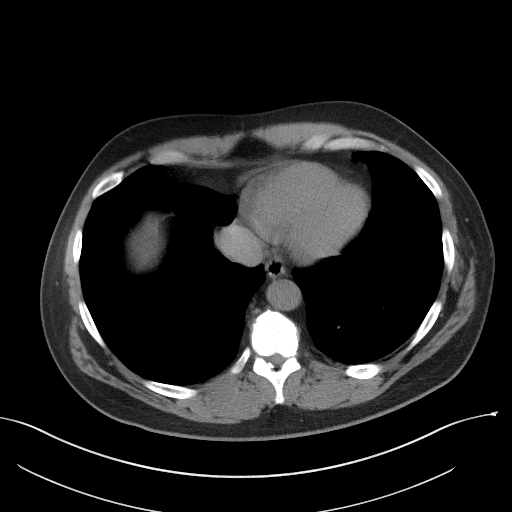

[Series 5: coronal · coronal · 0.79mm/px · 3 of 141 slices shown]
[im 47/141  soft-tissue]
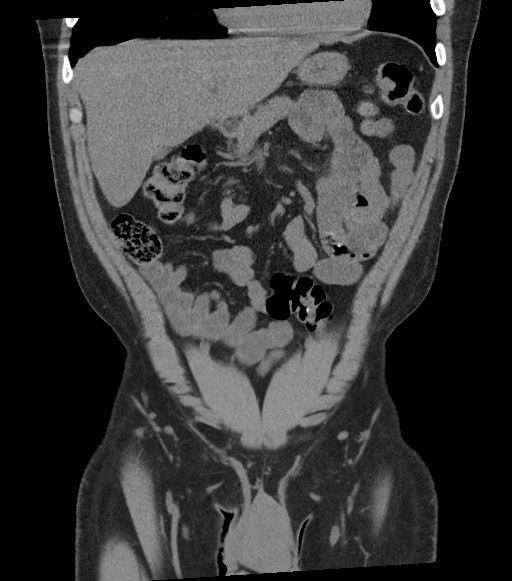
[im 63/141  soft-tissue]
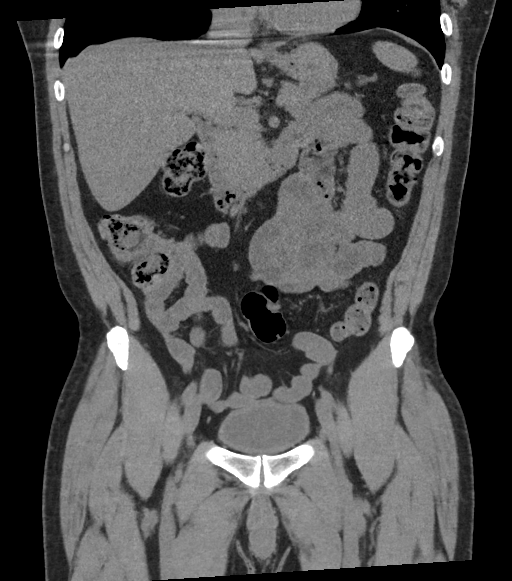
[im 78/141  soft-tissue]
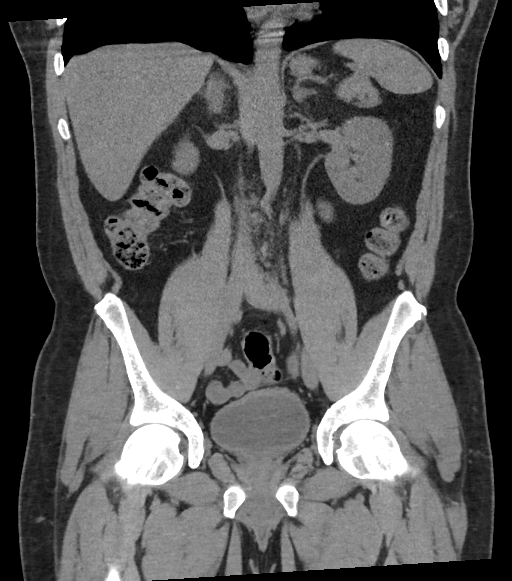

[17 of 46 positions shown; findings below may reference images not displayed]

FINDINGS: Lower chest: No acute abnormality.

Hepatobiliary: No focal liver abnormality is seen. No gallstones,
gallbladder wall thickening, or biliary dilatation. Contracted
gallbladder

Pancreas: Unremarkable. No pancreatic ductal dilatation or
surrounding inflammatory changes.

Spleen: Normal in size without focal abnormality.

Adrenals/Urinary Tract: Adrenal glands are unremarkable. Kidneys are
normal, without renal calculi, focal lesion, or hydronephrosis.
Bladder is unremarkable.

Stomach/Bowel: Stomach is within normal limits. Appendix appears
normal. No evidence of bowel wall thickening, distention, or
inflammatory changes.

Vascular/Lymphatic: No significant vascular findings are present. No
enlarged abdominal or pelvic lymph nodes.

Reproductive: Prostate is unremarkable.

Other: No abdominal wall hernia or abnormality. No abdominopelvic
ascites.

Musculoskeletal: No acute or significant osseous findings.
IMPRESSION: 1. Negative for nephrolithiasis, hydronephrosis or ureteral stone
2. There are no acute intra-abdominal or pelvic abnormalities
visualized

## 2019-08-29 ENCOUNTER — Ambulatory Visit: Payer: BC Managed Care – PPO | Attending: Internal Medicine

## 2019-08-29 DIAGNOSIS — Z20822 Contact with and (suspected) exposure to covid-19: Secondary | ICD-10-CM

## 2019-08-30 LAB — NOVEL CORONAVIRUS, NAA: SARS-CoV-2, NAA: NOT DETECTED

## 2020-06-06 ENCOUNTER — Other Ambulatory Visit: Payer: Self-pay

## 2020-06-06 ENCOUNTER — Emergency Department
Admission: EM | Admit: 2020-06-06 | Discharge: 2020-06-06 | Disposition: A | Discharge status: Home / Self Care | Payer: BC Managed Care – PPO | Attending: Emergency Medicine | Admitting: Emergency Medicine

## 2020-06-06 ENCOUNTER — Emergency Department: Payer: BC Managed Care – PPO

## 2020-06-06 DIAGNOSIS — I10 Essential (primary) hypertension: Secondary | ICD-10-CM | POA: Diagnosis not present

## 2020-06-06 DIAGNOSIS — E119 Type 2 diabetes mellitus without complications: Secondary | ICD-10-CM | POA: Diagnosis not present

## 2020-06-06 DIAGNOSIS — Z87891 Personal history of nicotine dependence: Secondary | ICD-10-CM | POA: Insufficient documentation

## 2020-06-06 DIAGNOSIS — R0789 Other chest pain: Secondary | ICD-10-CM | POA: Diagnosis not present

## 2020-06-06 DIAGNOSIS — Z794 Long term (current) use of insulin: Secondary | ICD-10-CM | POA: Diagnosis not present

## 2020-06-06 DIAGNOSIS — R079 Chest pain, unspecified: Secondary | ICD-10-CM | POA: Diagnosis present

## 2020-06-06 LAB — CBC WITH DIFFERENTIAL/PLATELET
Abs Immature Granulocytes: 0.02 10*3/uL (ref 0.00–0.07)
Basophils Absolute: 0.1 10*3/uL (ref 0.0–0.1)
Basophils Relative: 1 %
Eosinophils Absolute: 0.1 10*3/uL (ref 0.0–0.5)
Eosinophils Relative: 2 %
HCT: 41.3 % (ref 39.0–52.0)
Hemoglobin: 14.5 g/dL (ref 13.0–17.0)
Immature Granulocytes: 0 %
Lymphocytes Relative: 26 %
Lymphs Abs: 1.9 10*3/uL (ref 0.7–4.0)
MCH: 29.5 pg (ref 26.0–34.0)
MCHC: 35.1 g/dL (ref 30.0–36.0)
MCV: 84.1 fL (ref 80.0–100.0)
Monocytes Absolute: 0.6 10*3/uL (ref 0.1–1.0)
Monocytes Relative: 8 %
Neutro Abs: 4.5 10*3/uL (ref 1.7–7.7)
Neutrophils Relative %: 63 %
Platelets: 222 10*3/uL (ref 150–400)
RBC: 4.91 MIL/uL (ref 4.22–5.81)
RDW: 13.2 % (ref 11.5–15.5)
WBC: 7.2 10*3/uL (ref 4.0–10.5)
nRBC: 0 % (ref 0.0–0.2)

## 2020-06-06 LAB — BASIC METABOLIC PANEL
Anion gap: 9 (ref 5–15)
BUN: 15 mg/dL (ref 6–20)
CO2: 23 mmol/L (ref 22–32)
Calcium: 8.7 mg/dL — ABNORMAL LOW (ref 8.9–10.3)
Chloride: 107 mmol/L (ref 98–111)
Creatinine, Ser: 0.8 mg/dL (ref 0.61–1.24)
GFR, Estimated: >60 mL/min (ref >60)
Glucose, Bld: 182 mg/dL — ABNORMAL HIGH (ref 70–99)
Potassium: 3.4 mmol/L — ABNORMAL LOW (ref 3.5–5.1)
Sodium: 139 mmol/L (ref 135–145)

## 2020-06-06 LAB — TROPONIN I (HIGH SENSITIVITY)
Troponin I (High Sensitivity): 3 ng/L (ref <18)
Troponin I (High Sensitivity): 2 ng/L (ref <18)

## 2020-06-06 LAB — LIPASE, BLOOD: Lipase: 26 U/L (ref 11–51)

## 2020-06-06 LAB — HEPATIC FUNCTION PANEL
ALT: 23 U/L (ref 0–44)
AST: 21 U/L (ref 15–41)
Albumin: 4.2 g/dL (ref 3.5–5.0)
Alkaline Phosphatase: 104 U/L (ref 38–126)
Bilirubin, Direct: <0.1 mg/dL (ref 0.0–0.2)
Total Bilirubin: 0.7 mg/dL (ref 0.3–1.2)
Total Protein: 7.4 g/dL (ref 6.5–8.1)

## 2020-06-06 MED ORDER — FAMOTIDINE 20 MG PO TABS
40.0000 mg | ORAL_TABLET | Freq: Once | ORAL | Status: AC
  Administered 2020-06-06: 40 mg via ORAL
  Filled 2020-06-06: qty 2

## 2020-06-06 MED ORDER — FAMOTIDINE 20 MG PO TABS: 20.0000 mg | ORAL_TABLET | Freq: Two times a day (BID) | ORAL | 0 refills | Status: DC

## 2020-06-06 MED ORDER — ALUM & MAG HYDROXIDE-SIMETH 200-200-20 MG/5ML PO SUSP
30.0000 mL | Freq: Once | ORAL | Status: AC
  Administered 2020-06-06: 30 mL via ORAL
  Filled 2020-06-06: qty 30

## 2020-06-06 NOTE — Discharge Instructions (Signed)
Please follow up with your primary care doctor for blood pressure recheck in about a week.  Take famotidine in the meantime to lower stomach acid to see if this resolves your symptoms.

## 2020-06-06 NOTE — ED Triage Notes (Signed)
Pt states coming in for high blood pressure. Pt states blood pressure at home was 158/109. Pt states his head has been hurting too with blurred vission.  Pt denies history of high blood pressure

## 2020-06-06 NOTE — ED Provider Notes (Signed)
[facility] Emergency Department Provider Note  ____________________________________________  Time seen: Approximately 11:37 PM  I have reviewed the triage vital signs and the nursing notes.   HISTORY  Chief Complaint Hypertension    HPI Miguel Walters is a 54 y.o. male with a history of diabetes and obesity who comes ED complaining of elevated blood pressure today in the range of 150/100.  He normally does not check his blood pressure but today when he did he was concerned.  He also notes that he had about a 2-hour episode of central chest pain described as a dull ache, nonradiating, no aggravating or alleviating factors, no associated diaphoresis vomiting or shortness of breath.  He did have chorizo sausages for breakfast today     Past Medical History:  Diagnosis Date  . Anginal pain (HCC)   . Diabetes mellitus without complication (HCC)   . Elevated lipids   . H. pylori infection   . Heart murmur   . Hyperlipemia   . Hypertension   . Keloid   . Multiple gastric ulcers   . Murmur   . Sleep apnea      There are no problems to display for this patient.    Past Surgical History:  Procedure Laterality Date  . COLONOSCOPY WITH PROPOFOL N/A 11/08/2017   Procedure: COLONOSCOPY WITH PROPOFOL;  Surgeon: Scot Jun, MD;  Location: [facility] ENDOSCOPY;  Service: Endoscopy;  Laterality: N/A;  . FRACTURE SURGERY    . LEFT HEART CATH AND CORONARY ANGIOGRAPHY Left 10/10/2016   Procedure: Left Heart Cath and Coronary Angiography;  Surgeon: Alwyn Pea, MD;  Location: [facility] INVASIVE CV LAB;  Service: Cardiovascular;  Laterality: Left;     Prior to Admission medications   Medication Sig Start Date End Date Taking? Authorizing Provider  cyclobenzaprine (FLEXERIL) 10 MG tablet Take 1 tablet (10 mg total) by mouth 3 (three) times daily as needed for muscle spasms. 03/31/18   Cuthriell, Delorise Royals, PA-C  famotidine (PEPCID) 20 MG tablet Take 1 tablet (20  mg total) by mouth 2 (two) times daily. 06/06/20   Sharman Cheek, MD  insulin detemir (LEVEMIR) 100 UNIT/ML injection Inject 80 Units into the skin 2 (two) times daily.     [provider]  JARDIANCE 10 MG TABS tablet Take 10 mg by mouth daily. 06/04/17   [provider]  meloxicam (MOBIC) 15 MG tablet Take 1 tablet (15 mg total) by mouth daily. 03/31/18   Cuthriell, Delorise Royals, PA-C  naproxen (NAPROSYN) 500 MG tablet Take 1 tablet by mouth 2 (two) times daily. 07/03/17   [provider]  omeprazole (PRILOSEC) 20 MG capsule Take 20 mg by mouth daily.    [provider]  SitaGLIPtin-MetFORMIN HCl (JANUMET XR) 50-1000 MG TB24 Take 2 tablets by mouth daily with supper.    [provider]     Allergies Codeine, Oxycodone, and Hydrocodone   No family history on file.  Social History Social History   Tobacco Use  . Smoking status: Former Smoker    Quit date: 02/23/2007    Years since quitting: 13.2  . Smokeless tobacco: Never Used  Vaping Use  . Vaping Use: Never used  Substance Use Topics  . Alcohol use: No  . Drug use: No    Review of Systems  Constitutional:   No fever or chills.  ENT:   No sore throat. No rhinorrhea. Cardiovascular:   Positive chest pain as above without syncope. Respiratory:   No dyspnea  or cough. Gastrointestinal:   Negative for abdominal pain, vomiting and diarrhea.  Musculoskeletal:   Negative for focal pain or swelling All other systems reviewed and are negative except as documented above in ROS and HPI.  ____________________________________________   PHYSICAL EXAM:  VITAL SIGNS: ED Triage Vitals  Enc Vitals Group     BP 06/06/20 2003 (!) 147/91     Pulse Rate 06/06/20 2003 89     Resp 06/06/20 2003 20     Temp 06/06/20 2003 98.7 F (37.1 C)     Temp Source 06/06/20 2003 Oral     SpO2 06/06/20 2003 98 %     Weight 06/06/20 2003 180 lb (81.6 kg)     Height 06/06/20 2003 5\' 7"  (1.702 m)     Head  Circumference --      Peak Flow --      Pain Score 06/06/20 2007 10     Pain Loc --      Pain Edu? --      Excl. in GC? --     Vital signs reviewed, nursing assessments reviewed.   Constitutional:   Alert and oriented. Non-toxic appearance. Eyes:   Conjunctivae are normal. EOMI. PERRL. ENT      Head:   Normocephalic and atraumatic.      Nose:   Wearing a mask.      Mouth/Throat:   Wearing a mask.      Neck:   No meningismus. Full ROM. Hematological/Lymphatic/Immunilogical:   No cervical lymphadenopathy. Cardiovascular:   RRR. Symmetric bilateral radial and DP pulses.  No murmurs. Cap refill less than 2 seconds. Respiratory:   Normal respiratory effort without tachypnea/retractions. Breath sounds are clear and equal bilaterally. No wheezes/rales/rhonchi. Gastrointestinal:   Soft with mild left upper quadrant and epigastric tenderness.  Non distended. There is no CVA tenderness.  No rebound, rigidity, or guarding.  Musculoskeletal:   Normal range of motion in all extremities. No joint effusions.  No lower extremity tenderness.  No edema. Neurologic:   Normal speech and language.  Motor grossly intact. No acute focal neurologic deficits are appreciated.  Skin:    Skin is warm, dry and intact. No rash noted.  No petechiae, purpura, or bullae.  ____________________________________________    LABS (pertinent positives/negatives) (all labs ordered are listed, but only abnormal results are displayed) Labs Reviewed  BASIC METABOLIC PANEL - Abnormal; Notable for the following components:      Result Value   Potassium 3.4 (*)    Glucose, Bld 182 (*)    Calcium 8.7 (*)    All other components within normal limits  CBC WITH DIFFERENTIAL/PLATELET  HEPATIC FUNCTION PANEL  LIPASE, BLOOD  TROPONIN I (HIGH SENSITIVITY)  TROPONIN I (HIGH SENSITIVITY)   ____________________________________________   EKG  Interpreted by me Normal sinus rhythm rate of 77, normal axis and intervals.   Poor R wave progression.  Normal ST segments and T waves.  No ischemic changes.  ____________________________________________    RADIOLOGY  DG Chest Portable 1 View  Result Date: 06/06/2020 CLINICAL DATA:  Hypertension and chest pain EXAM: PORTABLE CHEST 1 VIEW COMPARISON:  06/17/2017 FINDINGS: The heart size and mediastinal contours are within normal limits. Both lungs are clear. The visualized skeletal structures are unremarkable. IMPRESSION: No active disease. Electronically Signed   By: 13/10/2016 M.D.   On: 06/06/2020 22:46    ____________________________________________   PROCEDURES Procedures  ____________________________________________  DIFFERENTIAL DIAGNOSIS   GERD, non-STEMI, pancreatitis, biliary disease  CLINICAL IMPRESSION /  ASSESSMENT AND PLAN / ED COURSE  Medications ordered in the ED: Medications  alum & mag hydroxide-simeth (MAALOX/MYLANTA) 200-200-20 MG/5ML suspension 30 mL (30 mLs Oral Given 06/06/20 2256)  famotidine (PEPCID) tablet 40 mg (40 mg Oral Given 06/06/20 2256)    Pertinent labs & imaging results that were available during my care of the patient were reviewed by me and considered in my medical decision making (see chart for details).  Miguel Walters was evaluated in Emergency Department on 06/06/2020 for the symptoms described in the history of present illness. He was evaluated in the context of the global COVID-19 pandemic, which necessitated consideration that the patient might be at risk for infection with the SARS-CoV-2 virus that causes COVID-19. Institutional protocols and algorithms that pertain to the evaluation of patients at risk for COVID-19 are in a state of rapid change based on information released by regulatory bodies including the CDC and federal and state organizations. These policies and algorithms were followed during the patient's care in the ED.   Patient presents with nonspecific chest pain, most likely GERD related to some  sausages he ate for breakfast today.  EKG is unremarkable, labs including serial troponins are normal.  Vital signs unremarkable and exam is benign.  Patient given Maalox and Pepcid, will continue him on Pepcid and have him follow-up with primary care within a week for recheck of his blood pressure.  Advised patient that because his blood pressure is not severely elevated, starting him on antihypertensives currently runs the risk of blood pressure being too low.  Will need to have this monitored by primary care to determine if and when he needs to be on medication for blood pressure management.  Given the reassuring exam and work-up, doubt ACS PE dissection AAA pneumothorax pericarditis or surgical abdomen.      ____________________________________________   FINAL CLINICAL IMPRESSION(S) / ED DIAGNOSES    Final diagnoses:  Atypical chest pain  Hypertension, unspecified type     ED Discharge Orders         Ordered    famotidine (PEPCID) 20 MG tablet  2 times daily        06/06/20 2337          Portions of this note were generated with dragon dictation software. Dictation errors may occur despite best attempts at proofreading.   Sharman Cheek, MD 06/06/20 [phone]

## 2020-07-02 DIAGNOSIS — R0789 Other chest pain: Secondary | ICD-10-CM | POA: Insufficient documentation

## 2020-07-02 DIAGNOSIS — R011 Cardiac murmur, unspecified: Secondary | ICD-10-CM | POA: Insufficient documentation

## 2020-07-02 DIAGNOSIS — I10 Essential (primary) hypertension: Secondary | ICD-10-CM | POA: Insufficient documentation

## 2020-07-03 ENCOUNTER — Ambulatory Visit (INDEPENDENT_AMBULATORY_CARE_PROVIDER_SITE_OTHER): Payer: BC Managed Care – PPO

## 2020-07-03 ENCOUNTER — Ambulatory Visit: Payer: BC Managed Care – PPO | Admitting: Podiatry

## 2020-07-03 ENCOUNTER — Encounter: Payer: Self-pay | Admitting: Podiatry

## 2020-07-03 ENCOUNTER — Other Ambulatory Visit: Payer: Self-pay

## 2020-07-03 DIAGNOSIS — M2012 Hallux valgus (acquired), left foot: Secondary | ICD-10-CM

## 2020-07-03 DIAGNOSIS — M2011 Hallux valgus (acquired), right foot: Secondary | ICD-10-CM

## 2020-07-03 DIAGNOSIS — M2010 Hallux valgus (acquired), unspecified foot: Secondary | ICD-10-CM

## 2020-07-03 NOTE — Progress Notes (Addendum)
   Subjective: 54 y.o. 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  Patient states that he experiences burning sensation with walking.  He states that his great toe is pushing against the second toe to the bilateral feet.  He also has a symptomatic callus lesion to the plantar aspect of the left hallux.  He has tried toe pads with minimal relief.  He presents for further treatment and evaluation   Past Medical History:  Diagnosis Date  . Anginal pain (HCC)   . Diabetes mellitus without complication (HCC)   . Elevated lipids   . H. pylori infection   . Heart murmur   . Hyperlipemia   . Hypertension   . Keloid   . Multiple gastric ulcers   . Murmur   . Sleep apnea       Objective: Physical Exam General: The patient is alert and oriented x3 in no acute distress.  Dermatology: Skin is cool, dry and supple bilateral lower extremities. Negative for open lesions or macerations.  Hyperkeratotic preulcerative callus lesions also noted to the plantar aspect of the left hallux.  Hyperkeratotic dystrophic elongated discolored nails also noted 1-5 bilateral consistent with findings of onychomycosis of the toenails  Vascular: Palpable pedal pulses bilaterally. No edema or erythema noted. Capillary refill within normal limits.  Neurological: Epicritic and protective threshold grossly intact bilaterally.   Musculoskeletal Exam: Clinical evidence of bunion deformity noted to the respective foot. There is moderate pain on palpation range of motion of the first MPJ. Lateral deviation of the hallux noted consistent with hallux abductovalgus.  Radiographic Exam: Increased intermetatarsal angle greater than 15 with a hallux abductus angle greater than 30 noted on AP view. Moderate degenerative changes noted within the first MPJ.  Assessment: 1. HAV w/ bunion deformity bilateral 2.   Onychomycosis of toenails bilateral   Plan of Care:  1. Patient was evaluated. X-Rays reviewed. 2.  Today we discussed surgical and conservative management of the bunion deformity.  I do believe the patient would benefit from surgical correction of the bunion deformity.  Currently he states that his A1c is 9.3 for his diabetes.  This was recently checked approximately 2 weeks ago.  I would prefer that is A1c is below 8.0 mg/dL prior to proceeding with surgical intervention. 3.  The patient would be able to take time off to allow for postoperative recovery.  The patient has short-term disability at work. 4.  Return to clinic in February once A1c levels are acceptable and we may proceed with surgery 5.  In regards to the onychomycosis of the toenails, prescription for Lamisil 250 mg #90 daily.  Patient most recently had a hepatic function panel on 06/06/2020 within normal limits.   Felecia Shelling, DPM [facility]  Dr. Felecia Shelling, DPM    [address]                                        [place], [place] [address]                Office [phone]  Fax [phone]

## 2020-07-08 ENCOUNTER — Telehealth: Payer: Self-pay

## 2020-07-08 NOTE — Telephone Encounter (Signed)
Patient called stating that you were going to call in a medication for nail fungus.  It does not say anything in your last note about that.  Please advise

## 2020-07-13 NOTE — Telephone Encounter (Signed)
Pt lvm inquiring about medication for to fungus, please advise.

## 2020-07-15 ENCOUNTER — Telehealth: Payer: Self-pay

## 2020-07-15 NOTE — Telephone Encounter (Signed)
Patient is calling again inquiring about fungal medication that you were supposed to call in.  There is nothing in your note about that.  Please advise

## 2020-07-17 ENCOUNTER — Other Ambulatory Visit: Payer: Self-pay | Admitting: Podiatry

## 2020-07-17 MED ORDER — CLOTRIMAZOLE-BETAMETHASONE 1-0.05 % EX CREA: 1.0000 "application " | TOPICAL_CREAM | Freq: Two times a day (BID) | CUTANEOUS | 1 refills | Status: DC

## 2020-07-17 MED ORDER — TERBINAFINE HCL 250 MG PO TABS: 250.0000 mg | ORAL_TABLET | Freq: Every day | ORAL | 0 refills | Status: DC

## 2020-07-17 NOTE — Addendum Note (Signed)
Addended by: Felecia Shelling on: 07/17/2020 04:21 PM   Modules accepted: Orders

## 2020-07-17 NOTE — Progress Notes (Signed)
PRN tinea pedis 

## 2020-10-21 ENCOUNTER — Other Ambulatory Visit: Payer: Self-pay | Admitting: Podiatry

## 2020-10-21 NOTE — Telephone Encounter (Signed)
Please advise 

## 2021-09-11 ENCOUNTER — Emergency Department: Payer: BC Managed Care – PPO

## 2021-09-11 ENCOUNTER — Other Ambulatory Visit: Payer: Self-pay

## 2021-09-11 DIAGNOSIS — Z79899 Other long term (current) drug therapy: Secondary | ICD-10-CM | POA: Insufficient documentation

## 2021-09-11 DIAGNOSIS — E119 Type 2 diabetes mellitus without complications: Secondary | ICD-10-CM | POA: Insufficient documentation

## 2021-09-11 DIAGNOSIS — R0600 Dyspnea, unspecified: Secondary | ICD-10-CM | POA: Insufficient documentation

## 2021-09-11 DIAGNOSIS — R531 Weakness: Secondary | ICD-10-CM | POA: Diagnosis not present

## 2021-09-11 DIAGNOSIS — I10 Essential (primary) hypertension: Secondary | ICD-10-CM | POA: Insufficient documentation

## 2021-09-11 DIAGNOSIS — Z20822 Contact with and (suspected) exposure to covid-19: Secondary | ICD-10-CM | POA: Diagnosis not present

## 2021-09-11 DIAGNOSIS — R0602 Shortness of breath: Secondary | ICD-10-CM | POA: Insufficient documentation

## 2021-09-11 DIAGNOSIS — Z794 Long term (current) use of insulin: Secondary | ICD-10-CM | POA: Insufficient documentation

## 2021-09-11 DIAGNOSIS — R0789 Other chest pain: Secondary | ICD-10-CM | POA: Insufficient documentation

## 2021-09-11 DIAGNOSIS — Z7984 Long term (current) use of oral hypoglycemic drugs: Secondary | ICD-10-CM | POA: Insufficient documentation

## 2021-09-11 NOTE — ED Triage Notes (Addendum)
Pt states he feels shob and achy. Pt states "I just don't feel well". Pt also complains of low back pain. Pt states has been shob "for awhile". Pt states he has no energy as well. Pt appears in no acute distress. Pt states he also had chest pain yesterday.

## 2021-09-12 ENCOUNTER — Encounter: Payer: Self-pay | Admitting: Emergency Medicine

## 2021-09-12 ENCOUNTER — Emergency Department
Admission: EM | Admit: 2021-09-12 | Discharge: 2021-09-12 | Disposition: A | Discharge status: Home / Self Care | Payer: BC Managed Care – PPO | Attending: Emergency Medicine | Admitting: Emergency Medicine

## 2021-09-12 DIAGNOSIS — R0789 Other chest pain: Secondary | ICD-10-CM

## 2021-09-12 DIAGNOSIS — R0609 Other forms of dyspnea: Secondary | ICD-10-CM

## 2021-09-12 DIAGNOSIS — R531 Weakness: Secondary | ICD-10-CM

## 2021-09-12 LAB — URINALYSIS, ROUTINE W REFLEX MICROSCOPIC
Bilirubin Urine: NEGATIVE
Glucose, UA: >1000 mg/dL — AB
Hgb urine dipstick: NEGATIVE
Ketones, ur: 15 mg/dL — AB
Leukocytes,Ua: NEGATIVE
Nitrite: NEGATIVE
Protein, ur: NEGATIVE mg/dL
Specific Gravity, Urine: 1.015 (ref 1.005–1.030)
pH: 5.5 (ref 5.0–8.0)

## 2021-09-12 LAB — TROPONIN I (HIGH SENSITIVITY)
Troponin I (High Sensitivity): 3 ng/L (ref <18)
Troponin I (High Sensitivity): <2 ng/L (ref <18)

## 2021-09-12 LAB — CBC WITH DIFFERENTIAL/PLATELET
Abs Immature Granulocytes: 0.03 10*3/uL (ref 0.00–0.07)
Basophils Absolute: 0.1 10*3/uL (ref 0.0–0.1)
Basophils Relative: 1 %
Eosinophils Absolute: 0.2 10*3/uL (ref 0.0–0.5)
Eosinophils Relative: 2 %
HCT: 45.9 % (ref 39.0–52.0)
Hemoglobin: 15.6 g/dL (ref 13.0–17.0)
Immature Granulocytes: 0 %
Lymphocytes Relative: 15 %
Lymphs Abs: 1.4 10*3/uL (ref 0.7–4.0)
MCH: 29.4 pg (ref 26.0–34.0)
MCHC: 34 g/dL (ref 30.0–36.0)
MCV: 86.4 fL (ref 80.0–100.0)
Monocytes Absolute: 0.7 10*3/uL (ref 0.1–1.0)
Monocytes Relative: 8 %
Neutro Abs: 7 10*3/uL (ref 1.7–7.7)
Neutrophils Relative %: 74 %
Platelets: 213 10*3/uL (ref 150–400)
RBC: 5.31 MIL/uL (ref 4.22–5.81)
RDW: 13.3 % (ref 11.5–15.5)
WBC: 9.4 10*3/uL (ref 4.0–10.5)
nRBC: 0 % (ref 0.0–0.2)

## 2021-09-12 LAB — COMPREHENSIVE METABOLIC PANEL
ALT: 23 U/L (ref 0–44)
AST: 21 U/L (ref 15–41)
Albumin: 4.1 g/dL (ref 3.5–5.0)
Alkaline Phosphatase: 104 U/L (ref 38–126)
Anion gap: 7 (ref 5–15)
BUN: 12 mg/dL (ref 6–20)
CO2: 26 mmol/L (ref 22–32)
Calcium: 9.2 mg/dL (ref 8.9–10.3)
Chloride: 101 mmol/L (ref 98–111)
Creatinine, Ser: 0.89 mg/dL (ref 0.61–1.24)
GFR, Estimated: >60 mL/min (ref >60)
Glucose, Bld: 127 mg/dL — ABNORMAL HIGH (ref 70–99)
Potassium: 3.6 mmol/L (ref 3.5–5.1)
Sodium: 134 mmol/L — ABNORMAL LOW (ref 135–145)
Total Bilirubin: 0.8 mg/dL (ref 0.3–1.2)
Total Protein: 7.4 g/dL (ref 6.5–8.1)

## 2021-09-12 LAB — D-DIMER, QUANTITATIVE: D-Dimer, Quant: 0.38 ug/mL-FEU (ref 0.00–0.50)

## 2021-09-12 LAB — RESP PANEL BY RT-PCR (FLU A&B, COVID) ARPGX2
Influenza A by PCR: NEGATIVE
Influenza B by PCR: NEGATIVE
SARS Coronavirus 2 by RT PCR: NEGATIVE

## 2021-09-12 MED ORDER — SODIUM CHLORIDE 0.9 % IV BOLUS (SEPSIS)
1000.0000 mL | Freq: Once | INTRAVENOUS | Status: AC
  Administered 2021-09-12: 1000 mL via INTRAVENOUS

## 2021-09-12 MED ORDER — ACETAMINOPHEN 500 MG PO TABS
1000.0000 mg | ORAL_TABLET | Freq: Once | ORAL | Status: AC
  Administered 2021-09-12: 1000 mg via ORAL
  Filled 2021-09-12: qty 2

## 2021-09-12 NOTE — ED Provider Notes (Signed)
[facility] Provider Note    Event Date/Time   First MD Initiated Contact with Patient 09/12/21 [phone]     (approximate)   History   Shortness of Breath   HPI  Miguel Walters is a 56 y.o. male with history of hypertension, diabetes, hyperlipidemia, sleep apnea who presents to the emergency department with his wife with complaints of not feeling well today.  Reports he has felt short of breath worse with exertion for "a wild".  He states this has been ongoing for several months.  He is also had intermittent chest discomfort in the left side of his chest for the past couple of days that he describes as a "dull" pain.  He denies any fevers, cough, nausea, vomiting, diarrhea.  States he feels very tired and weak today.  No history of PE or DVT.  No lower extremity swelling or pain.   History provided by patient and wife.    Past Medical History:  Diagnosis Date   Anginal pain (HCC)    Diabetes mellitus without complication (HCC)    Elevated lipids    H. pylori infection    Heart murmur    Hyperlipemia    Hypertension    Keloid    Multiple gastric ulcers    Murmur    Sleep apnea     Past Surgical History:  Procedure Laterality Date   COLONOSCOPY WITH PROPOFOL N/A 11/08/2017   Procedure: COLONOSCOPY WITH PROPOFOL;  Surgeon: Scot Jun, MD;  Location: [facility] ENDOSCOPY;  Service: Endoscopy;  Laterality: N/A;   FRACTURE SURGERY     LEFT HEART CATH AND CORONARY ANGIOGRAPHY Left 10/10/2016   Procedure: Left Heart Cath and Coronary Angiography;  Surgeon: Alwyn Pea, MD;  Location: [facility] INVASIVE CV LAB;  Service: Cardiovascular;  Laterality: Left;    MEDICATIONS:  Prior to Admission medications   Medication Sig Start Date End Date Taking? Authorizing Provider  atorvastatin (LIPITOR) 40 MG tablet Take 40 mg by mouth at bedtime. 05/11/20   [provider]  clotrimazole-betamethasone (LOTRISONE) cream Apply 1 application topically 2 (two)  times daily. 07/17/20   Felecia Shelling, DPM  cyclobenzaprine (FLEXERIL) 10 MG tablet Take 1 tablet (10 mg total) by mouth 3 (three) times daily as needed for muscle spasms. 03/31/18   Cuthriell, Delorise Royals, PA-C  famotidine (PEPCID) 20 MG tablet Take 1 tablet (20 mg total) by mouth 2 (two) times daily. 06/06/20   Sharman Cheek, MD  gabapentin (NEURONTIN) 100 MG capsule gabapentin 100 mg capsule  TAKE ONE CAPSULE BY MOUTH ONCE DAILY & TAKE 1 TO 2 CAPSULES AT BEDTIME 09/05/18   [provider]  insulin detemir (LEVEMIR) 100 UNIT/ML injection Inject 80 Units into the skin 2 (two) times daily.     [provider]  JARDIANCE 10 MG TABS tablet Take 10 mg by mouth daily. 06/04/17   [provider]  lisinopril (ZESTRIL) 2.5 MG tablet lisinopril 2.5 mg tablet  TAKE ONE TABLET BY MOUTH ONCE DAILY FOR KIDNEY PROTECTION 08/24/18   [provider]  omeprazole (PRILOSEC) 20 MG capsule Take 20 mg by mouth daily.    [provider]  SitaGLIPtin-MetFORMIN HCl (JANUMET XR) 50-1000 MG TB24 Take 2 tablets by mouth daily with supper.    [provider]  terbinafine (LAMISIL) 250 MG tablet Take 1 tablet (250 mg total) by mouth daily. 07/17/20   Felecia Shelling, DPM    Physical Exam   Triage Vital Signs: ED Triage Vitals [  09/11/21 2331]  Enc Vitals Group     BP (!) 143/87     Pulse Rate 88     Resp 18     Temp 98.2 F (36.8 C)     Temp Source Oral     SpO2 98 %     Weight 170 lb (77.1 kg)     Height 5\' 7"  (1.702 m)     Head Circumference      Peak Flow      Pain Score 7     Pain Loc      Pain Edu?      Excl. in GC?     Most recent vital signs: Vitals:   09/12/21 0435 09/12/21 0500  BP: (!) 146/99 (!) 135/96  Pulse: 87 75  Resp: 18 15  Temp:    SpO2: 99% 99%    CONSTITUTIONAL: Alert and oriented and responds appropriately to questions. Well-appearing; well-nourished HEAD: Normocephalic, atraumatic EYES: Conjunctivae clear, pupils appear  equal, sclera nonicteric ENT: normal nose; moist mucous membranes NECK: Supple, normal ROM CARD: RRR; S1 and S2 appreciated; no murmurs, no clicks, no rubs, no gallops RESP: Normal chest excursion without splinting or tachypnea; breath sounds clear and equal bilaterally; no wheezes, no rhonchi, no rales, no hypoxia or respiratory distress, speaking full sentences ABD/GI: Normal bowel sounds; non-distended; soft, non-tender, no rebound, no guarding, no peritoneal signs BACK: The back appears normal EXT: Normal ROM in all joints; no deformity noted, no edema; no cyanosis, no calf tenderness or calf swelling SKIN: Normal color for age and race; warm; no rash on exposed skin NEURO: Moves all extremities equally, normal speech PSYCH: The patient's mood and manner are appropriate.   ED Results / Procedures / Treatments   LABS: (all labs ordered are listed, but only abnormal results are displayed) Labs Reviewed  COMPREHENSIVE METABOLIC PANEL - Abnormal; Notable for the following components:      Result Value   Sodium 134 (*)    Glucose, Bld 127 (*)    All other components within normal limits  RESP PANEL BY RT-PCR (FLU A&B, COVID) ARPGX2  CBC WITH DIFFERENTIAL/PLATELET  D-DIMER, QUANTITATIVE  URINALYSIS, ROUTINE W REFLEX MICROSCOPIC  TROPONIN I (HIGH SENSITIVITY)  TROPONIN I (HIGH SENSITIVITY)     EKG:  EKG Interpretation  Date/Time:  Saturday September 11 2021 23:41:48 EST Ventricular Rate:  85 PR Interval:  158 QRS Duration: 96 QT Interval:  384 QTC Calculation: 456 R Axis:   -38 Text Interpretation: Normal sinus rhythm Left axis deviation Cannot rule out Anterior infarct (cited on or before 06-Jun-2020) No significant change since last tracing Confirmed by Rochele RaringWard, Jerika Wales [phone]([id number]) on 09/12/2021 4:23:11 AM         RADIOLOGY: My personal review and interpretation of imaging: Chest x-ray clear.  I have personally reviewed all radiology reports.   DG Chest 2 View  Result  Date: 09/11/2021 CLINICAL DATA:  Shortness of breath EXAM: CHEST - 2 VIEW COMPARISON:  06/06/2020 FINDINGS: The heart size and mediastinal contours are within normal limits. Both lungs are clear. The visualized skeletal structures are unremarkable. IMPRESSION: No active cardiopulmonary disease. Electronically Signed   By: Alcide CleverMark  Lukens M.D.   On: 09/11/2021 23:52     PROCEDURES:  Critical Care performed: No      .1-3 Lead EKG Interpretation Performed by: Mckyla Deckman, Layla MawKristen N, DO Authorized by: Inge Waldroup, Layla MawKristen N, DO     Interpretation: normal     ECG rate:  75   ECG rate assessment:  normal     Rhythm: sinus rhythm     Ectopy: none     Conduction: normal      IMPRESSION / MDM / ASSESSMENT AND PLAN / ED COURSE  I reviewed the triage vital signs and the nursing notes.    Patient here with complaints of atypical chest pain and shortness of breath with exertion ongoing for several months.  The patient is on the cardiac monitor to evaluate for evidence of arrhythmia and/or significant heart rate changes.   DIFFERENTIAL DIAGNOSIS (includes but not limited to):   ACS, PE, dissection, pneumonia, pneumothorax, dehydration, anemia, electrolyte derangement, UTI   PLAN: Cardiac labs, chest x-ray, EKG, urine, IV fluids, Tylenol for pain   MEDICATIONS GIVEN IN ED: Medications  acetaminophen (TYLENOL) tablet 1,000 mg (1,000 mg Oral Given 09/12/21 0438)  sodium chloride 0.9 % bolus 1,000 mL (1,000 mLs Intravenous New Bag/Given 09/12/21 0439)     ED COURSE: Patient's labs reassuring.  He has had 2 normal troponins.  Normal hemoglobin and electrolytes.  His EKG is nonischemic.  Chest x-ray reviewed by myself and radiologist shows no infiltrate, edema or pneumothorax.  D-dimer negative.  COVID and flu negative.  Urine shows no sign of infection or significant dehydration.  He remains hemodynamically stable and has no current chest pain.  He has follow-up with his cardiologist scheduled in 1 day.  I  feel he is safe for discharge home.  He is also comfortable with this plan.  At this time, I do not feel there is any life-threatening condition present. I reviewed all nursing notes, vitals, pertinent previous records.  All lab and urine results, EKGs, imaging ordered have been independently reviewed and interpreted by myself.  I reviewed all available radiology reports from any imaging ordered this visit.  Based on my assessment, I feel the patient is safe to be discharged home without further emergent workup and can continue workup as an outpatient as needed. Discussed all findings, treatment plan as well as usual and customary return precautions with patient and wife.  They verbalize understanding and are comfortable with this plan.  Outpatient follow-up has been provided as needed.  All questions have been answered.    CONSULTS: No admission required at this time given reassuring cardiac work-up with stable symptoms ongoing for several months.  He does have a risk factors for ACS but I feel he is safe for outpatient follow-up.   OUTSIDE RECORDS REVIEWED: Reviewed patient's previous left heart cath on 10/10/2016.  The left ventricular systolic function is normal. LV end diastolic pressure is normal. The left ventricular ejection fraction is 55-65% by visual estimate. Left dominant system with no significant obstructive coronary disease         FINAL CLINICAL IMPRESSION(S) / ED DIAGNOSES   Final diagnoses:  Atypical chest pain  DOE (dyspnea on exertion)  Generalized weakness     Rx / DC Orders   ED Discharge Orders     None        Note:  This document was prepared using Dragon voice recognition software and may include unintentional dictation errors.   Aerial Dilley, Layla Maw, DO 09/12/21 [phone]

## 2021-09-12 NOTE — Discharge Instructions (Signed)
Please follow-up with Dr. Clayborn Bigness as scheduled on Monday.  Your cardiac work-up today was reassuring.

## 2021-10-08 ENCOUNTER — Encounter: Payer: Self-pay | Admitting: Podiatry

## 2021-10-08 ENCOUNTER — Ambulatory Visit: Payer: BC Managed Care – PPO | Admitting: Podiatry

## 2021-10-08 ENCOUNTER — Other Ambulatory Visit: Payer: Self-pay

## 2021-10-08 DIAGNOSIS — M2011 Hallux valgus (acquired), right foot: Secondary | ICD-10-CM | POA: Diagnosis not present

## 2021-10-08 DIAGNOSIS — M2012 Hallux valgus (acquired), left foot: Secondary | ICD-10-CM

## 2021-10-08 DIAGNOSIS — M79675 Pain in left toe(s): Secondary | ICD-10-CM

## 2021-10-08 DIAGNOSIS — M79674 Pain in right toe(s): Secondary | ICD-10-CM | POA: Diagnosis not present

## 2021-10-08 DIAGNOSIS — E0843 Diabetes mellitus due to underlying condition with diabetic autonomic (poly)neuropathy: Secondary | ICD-10-CM

## 2021-10-08 DIAGNOSIS — B351 Tinea unguium: Secondary | ICD-10-CM | POA: Diagnosis not present

## 2021-10-08 NOTE — Progress Notes (Signed)
° °  SUBJECTIVE Patient with a history of diabetes mellitus presents to office today complaining of elongated, thickened nails that cause pain while ambulating in shoes.  Patient is unable to trim their own nails.  Patient also complains of symptomatic bunions to bilateral feet.  We had discussed surgery at his last visit however he is going to hold off for now.  He presents for further treatment and evaluation patient is here for further evaluation and treatment.   Past Medical History:  Diagnosis Date   Anginal pain (HCC)    Diabetes mellitus without complication (HCC)    Elevated lipids    H. pylori infection    Heart murmur    Hyperlipemia    Hypertension    Keloid    Multiple gastric ulcers    Murmur    Sleep apnea    Past Surgical History:  Procedure Laterality Date   COLONOSCOPY WITH PROPOFOL N/A 11/08/2017   Procedure: COLONOSCOPY WITH PROPOFOL;  Surgeon: Scot Jun, MD;  Location: [facility] ENDOSCOPY;  Service: Endoscopy;  Laterality: N/A;   FRACTURE SURGERY     LEFT HEART CATH AND CORONARY ANGIOGRAPHY Left 10/10/2016   Procedure: Left Heart Cath and Coronary Angiography;  Surgeon: Alwyn Pea, MD;  Location: [facility] INVASIVE CV LAB;  Service: Cardiovascular;  Laterality: Left;   Allergies  Allergen Reactions   Codeine    Oxycodone Itching   Hydrocodone Rash    OBJECTIVE General Patient is awake, alert, and oriented x 3 and in no acute distress. Derm Skin is dry and supple bilateral. Negative open lesions or macerations. Remaining integument unremarkable. Nails are tender, long, thickened and dystrophic with subungual debris, consistent with onychomycosis, 1-5 bilateral. No signs of infection noted. Vasc  DP and PT pedal pulses palpable bilaterally. Temperature gradient within normal limits.  Neuro Epicritic and protective threshold sensation diminished bilaterally.  Musculoskeletal Exam hallux valgus noted bilateral feet  ASSESSMENT 1. Diabetes Mellitus w/  peripheral neuropathy 2.  Pain due to onychomycosis of toenails bilateral 3.  Hallux valgus bilateral  PLAN OF CARE 1. Patient evaluated today. 2. Instructed to maintain good pedal hygiene and foot care. Stressed importance of controlling blood sugar.  3. Mechanical debridement of nails 1-5 bilaterally performed using a nail nipper. Filed with dremel without incident.  4.  Today we discussed different conservative treatment modalities for the bunion.  Silicone toe spacers were provided for the patient to apply between the toes  5.  Return to clinic in 3 mos.     Felecia Shelling, DPM [facility]  Dr. Felecia Shelling, DPM    [address] [place], [place] [address]                Office [phone]  Fax [phone]

## 2021-10-11 ENCOUNTER — Ambulatory Visit: Payer: BC Managed Care – PPO | Admitting: Podiatry

## 2021-11-14 ENCOUNTER — Other Ambulatory Visit: Payer: Self-pay

## 2021-11-14 ENCOUNTER — Emergency Department
Admission: EM | Admit: 2021-11-14 | Discharge: 2021-11-14 | Disposition: A | Discharge status: Home / Self Care | Payer: BC Managed Care – PPO | Attending: Emergency Medicine | Admitting: Emergency Medicine

## 2021-11-14 ENCOUNTER — Emergency Department: Payer: BC Managed Care – PPO

## 2021-11-14 ENCOUNTER — Encounter: Payer: Self-pay | Admitting: Intensive Care

## 2021-11-14 DIAGNOSIS — E1165 Type 2 diabetes mellitus with hyperglycemia: Secondary | ICD-10-CM | POA: Insufficient documentation

## 2021-11-14 DIAGNOSIS — M545 Low back pain, unspecified: Secondary | ICD-10-CM | POA: Insufficient documentation

## 2021-11-14 DIAGNOSIS — R8271 Bacteriuria: Secondary | ICD-10-CM | POA: Insufficient documentation

## 2021-11-14 DIAGNOSIS — I10 Essential (primary) hypertension: Secondary | ICD-10-CM | POA: Insufficient documentation

## 2021-11-14 DIAGNOSIS — R109 Unspecified abdominal pain: Secondary | ICD-10-CM | POA: Insufficient documentation

## 2021-11-14 DIAGNOSIS — R000 Tachycardia, unspecified: Secondary | ICD-10-CM | POA: Diagnosis not present

## 2021-11-14 LAB — CBC WITH DIFFERENTIAL/PLATELET
Abs Immature Granulocytes: 0.02 10*3/uL (ref 0.00–0.07)
Basophils Absolute: 0 10*3/uL (ref 0.0–0.1)
Basophils Relative: 1 %
Eosinophils Absolute: 0.1 10*3/uL (ref 0.0–0.5)
Eosinophils Relative: 2 %
HCT: 45.6 % (ref 39.0–52.0)
Hemoglobin: 15.4 g/dL (ref 13.0–17.0)
Immature Granulocytes: 0 %
Lymphocytes Relative: 19 %
Lymphs Abs: 1.3 10*3/uL (ref 0.7–4.0)
MCH: 29.3 pg (ref 26.0–34.0)
MCHC: 33.8 g/dL (ref 30.0–36.0)
MCV: 86.9 fL (ref 80.0–100.0)
Monocytes Absolute: 0.4 10*3/uL (ref 0.1–1.0)
Monocytes Relative: 6 %
Neutro Abs: 4.8 10*3/uL (ref 1.7–7.7)
Neutrophils Relative %: 72 %
Platelets: 215 10*3/uL (ref 150–400)
RBC: 5.25 MIL/uL (ref 4.22–5.81)
RDW: 13.9 % (ref 11.5–15.5)
WBC: 6.6 10*3/uL (ref 4.0–10.5)
nRBC: 0 % (ref 0.0–0.2)

## 2021-11-14 LAB — BASIC METABOLIC PANEL
Anion gap: 8 (ref 5–15)
BUN: 13 mg/dL (ref 6–20)
CO2: 27 mmol/L (ref 22–32)
Calcium: 9 mg/dL (ref 8.9–10.3)
Chloride: 103 mmol/L (ref 98–111)
Creatinine, Ser: 0.94 mg/dL (ref 0.61–1.24)
GFR, Estimated: >60 mL/min (ref >60)
Glucose, Bld: 248 mg/dL — ABNORMAL HIGH (ref 70–99)
Potassium: 3.7 mmol/L (ref 3.5–5.1)
Sodium: 138 mmol/L (ref 135–145)

## 2021-11-14 LAB — URINALYSIS, ROUTINE W REFLEX MICROSCOPIC
Bilirubin Urine: NEGATIVE
Glucose, UA: >=500 mg/dL — AB
Hgb urine dipstick: NEGATIVE
Ketones, ur: NEGATIVE mg/dL
Leukocytes,Ua: NEGATIVE
Nitrite: NEGATIVE
Protein, ur: NEGATIVE mg/dL
Specific Gravity, Urine: 1.03 (ref 1.005–1.030)
pH: 6 (ref 5.0–8.0)

## 2021-11-14 MED ORDER — LIDOCAINE 5 % EX PTCH
1.0000 | MEDICATED_PATCH | CUTANEOUS | Status: DC
  Administered 2021-11-14: 1 via TRANSDERMAL
  Filled 2021-11-14: qty 1

## 2021-11-14 MED ORDER — KETOROLAC TROMETHAMINE 30 MG/ML IJ SOLN
30.0000 mg | Freq: Once | INTRAMUSCULAR | Status: AC
  Administered 2021-11-14: 30 mg via INTRAVENOUS
  Filled 2021-11-14: qty 1

## 2021-11-14 MED ORDER — LIDOCAINE 5 % EX PTCH
1.0000 | MEDICATED_PATCH | Freq: Two times a day (BID) | CUTANEOUS | 0 refills | Status: AC
Start: 2021-11-14 — End: 2021-11-29

## 2021-11-14 MED ORDER — METHOCARBAMOL 500 MG PO TABS: 500.0000 mg | ORAL_TABLET | Freq: Three times a day (TID) | ORAL | 0 refills | Status: AC

## 2021-11-14 NOTE — Discharge Instructions (Addendum)
-  Continue to treat pain with Tylenol/ibuprofen as needed.  You may additionally utilize methocarbamol as muscle relaxant, as well as lidocaine patches. ?-Follow-up with your primary care provider as needed. ?-Return to the emergency department anytime if you begin to experience any new or worsening symptoms. ?

## 2021-11-14 NOTE — ED Provider Notes (Signed)
? ?[facility] ?Provider Note ? ? ? Event Date/Time  ? First MD Initiated Contact with Patient 11/14/21 [phone]   ?  (approximate) ? ? ?History  ? ?Chief Complaint ?Back Pain ? ? ?HPI ?Miguel Walters is a 56 y.o. male, history of hyperlipidemia, hypertension, diabetes, OSA, presents the emergency department for evaluation of left-sided back/flank pain.  Patient states that his pain has been going on for the past 2 to 3 weeks.  He denies any recent heavy lifting or history of low back pain.  He states that his pain is predominantly in his left flank.  He is able to ambulate well without assistance.  Denies any urinary symptoms at this time.  Additionally denies fever/chills, chest pain, shortness of breath, abdominal pain, nausea/vomiting, numbness/tingling in upper or lower extremities, rashes, lightheadedness, dizziness, or headaches ? ?History Limitations: No limitations. ? ?  ? ? ?Physical Exam  ?Triage Vital Signs: ?ED Triage Vitals [11/14/21 0930]  ?Enc Vitals Group  ?   BP (!) 144/90  ?   Pulse Rate (!) 114  ?   Resp 18  ?   Temp 98.2 ?F (36.8 ?C)  ?   Temp Source Oral  ?   SpO2 95 %  ?   Weight 184 lb (83.5 kg)  ?   Height 5\' 7"  (1.702 m)  ?   Head Circumference   ?   Peak Flow   ?   Pain Score 10  ?   Pain Loc   ?   Pain Edu?   ?   Excl. in GC?   ? ? ?Most recent vital signs: ?Vitals:  ? 11/14/21 1145 11/14/21 1150  ?BP:  127/87  ?Pulse: 100 (!) 101  ?Resp:  18  ?Temp:    ?SpO2: 95% 97%  ? ? ?General: Awake, NAD.  ?Skin: Warm, dry.  ?CV: Good peripheral perfusion.  ?Resp: Normal effort.  ?Abd: Soft, non-tender. No distention.  ?Neuro: At baseline. No gross neurological deficits.  ?Other: Left-sided CVA tenderness.  No midline spinal pain.  Negative straight leg test.  Pulse, motor, sensation intact distally in all extremities.  Patient is able to ambulate across room without assistance. ? ?Physical Exam ? ? ? ?ED Results / Procedures / Treatments  ?Labs ?(all labs ordered are listed, but  only abnormal results are displayed) ?Labs Reviewed  ?BASIC METABOLIC PANEL - Abnormal; Notable for the following components:  ?    Result Value  ? Glucose, Bld 248 (*)   ? All other components within normal limits  ?URINALYSIS, ROUTINE W REFLEX MICROSCOPIC - Abnormal; Notable for the following components:  ? Color, Urine YELLOW (*)   ? APPearance CLEAR (*)   ? Glucose, UA >=500 (*)   ? Bacteria, UA MANY (*)   ? All other components within normal limits  ?CBC WITH DIFFERENTIAL/PLATELET  ? ? ? ?EKG ?Not applicable ? ? ?RADIOLOGY ? ?ED Provider Interpretation: I personally reviewed the CT imaging, no evidence of acute findings. ? ?CT Renal Stone Study ? ?Result Date: 11/14/2021 ?CLINICAL DATA:  56 year old male with acute LEFT abdominal, flank and pelvic pain. EXAM: CT ABDOMEN AND PELVIS WITHOUT CONTRAST TECHNIQUE: Multidetector CT imaging of the abdomen and pelvis was performed following the standard protocol without IV contrast. RADIATION DOSE REDUCTION: This exam was performed according to the departmental dose-optimization program which includes automated exposure control, adjustment of the mA and/or kV according to patient size and/or use of iterative reconstruction technique. COMPARISON:  11/21/2017 CT  and prior studies FINDINGS: Please note that parenchymal and vascular abnormalities may be missed as intravenous contrast was not administered. Lower chest: No acute abnormality. Hepatobiliary: The liver and gallbladder are unremarkable. No biliary dilatation. Pancreas: Unremarkable Spleen: Unremarkable Adrenals/Urinary Tract: The kidneys, adrenal glands and bladder are unremarkable. Stomach/Bowel: Stomach is within normal limits. Appendix appears normal. No evidence of bowel wall thickening, distention, or inflammatory changes. Vascular/Lymphatic: No significant vascular findings are present. No enlarged abdominal or pelvic lymph nodes. Reproductive: Prostate is unremarkable. Other: No ascites, focal collection or  pneumoperitoneum. Musculoskeletal: No acute or suspicious bony abnormalities are noted. IMPRESSION: No acute or significant abnormalities identified within the abdomen or pelvis. Electronically Signed   By: Harmon Pier M.D.   On: 11/14/2021 11:31   ? ?PROCEDURES: ? ?Critical Care performed: None. ? ?Procedures ? ? ? ?MEDICATIONS ORDERED IN ED: ?Medications  ?ketorolac (TORADOL) 30 MG/ML injection 30 mg (30 mg Intravenous Given 11/14/21 1010)  ? ? ? ?IMPRESSION / MDM / ASSESSMENT AND PLAN / ED COURSE  ?I reviewed the triage vital signs and the nursing notes. ?             ?               ? ? ?Differential diagnosis includes, but is not limited to, musculoskeletal strain, nephrolithiasis, cystitis, pyelonephritis. ? ?ED Course ?Patient appears well.  Notably tachycardic at 114, otherwise normal vitals for the patient.  NAD.  Endorsing mild-moderate pain.  We will go ahead treat with IV ketorolac. ? ?CBC shows no evidence of leukocytosis or anemia. ? ?BMP notable for elevated glucose at 248, consistent with the patient's history of diabetes, otherwise no evidence of kidney injury or electrolyte abnormalities. ? ?Urinalysis shows many bacteria, but no leukocytes or nitrates.  In the absence of urinary symptoms, unlikely urinary tract infection. ? ?Assessment/Plan ?Patient presented to the emergency department for 2 to 3 weeks of left-sided back pain.  No evidence of nephrolithiasis on CT renal study.  No evidence of infection at this time.  His pain is likely musculoskeletal in nature.  Will provide patient with a prescription for methocarbamol and lidocaine patches.  Encouraged him to follow-up with his primary care provider for ongoing management.  Will discharge. ? ?Considered admission for this patient, but given the patient's stable vitals, unremarkable work-up, and unremarkable imaging, he is unlikely to benefit. ? ?Patient was provided with anticipatory guidance, return precautions, and educational material.  Encouraged the patient to return to the emergency department at any time if they begin to experience any new or worsening symptoms.  ? ?  ? ? ?FINAL CLINICAL IMPRESSION(S) / ED DIAGNOSES  ? ?Final diagnoses:  ?Left-sided low back pain without sciatica, unspecified chronicity  ? ? ? ?Rx / DC Orders  ? ?ED Discharge Orders   ? ?      Ordered  ?  methocarbamol (ROBAXIN) 500 MG tablet  3 times daily       ? 11/14/21 1147  ?  lidocaine (LIDODERM) 5 %  Every 12 hours       ? 11/14/21 1147  ? ?  ?  ? ?  ? ? ? ?Note:  This document was prepared using Dragon voice recognition software and may include unintentional dictation errors. ?  ?Varney Daily, [place] ?11/15/21 1610 ? ?  ?Georga Hacking, MD ?11/15/21 1622 ? ?

## 2021-11-14 NOTE — ED Triage Notes (Signed)
Patient c/o left lower back pain that started 2-3 weeks ago. Denies trouble urinating ?

## 2022-02-10 ENCOUNTER — Observation Stay
Admission: EM | Admit: 2022-02-10 | Discharge: 2022-02-11 | Disposition: A | Discharge status: Home / Self Care | Payer: BC Managed Care – PPO | Attending: Internal Medicine | Admitting: Internal Medicine

## 2022-02-10 ENCOUNTER — Other Ambulatory Visit: Payer: Self-pay

## 2022-02-10 ENCOUNTER — Emergency Department: Payer: BC Managed Care – PPO

## 2022-02-10 ENCOUNTER — Observation Stay
Admit: 2022-02-10 | Discharge: 2022-02-10 | Disposition: A | Discharge status: Home / Self Care | Payer: BC Managed Care – PPO | Attending: Cardiology | Admitting: Cardiology

## 2022-02-10 DIAGNOSIS — I10 Essential (primary) hypertension: Secondary | ICD-10-CM | POA: Diagnosis not present

## 2022-02-10 DIAGNOSIS — Z6829 Body mass index (BMI) 29.0-29.9, adult: Secondary | ICD-10-CM | POA: Diagnosis not present

## 2022-02-10 DIAGNOSIS — Z79899 Other long term (current) drug therapy: Secondary | ICD-10-CM | POA: Diagnosis not present

## 2022-02-10 DIAGNOSIS — R072 Precordial pain: Principal | ICD-10-CM | POA: Insufficient documentation

## 2022-02-10 DIAGNOSIS — I5030 Unspecified diastolic (congestive) heart failure: Secondary | ICD-10-CM | POA: Diagnosis not present

## 2022-02-10 DIAGNOSIS — E119 Type 2 diabetes mellitus without complications: Secondary | ICD-10-CM

## 2022-02-10 DIAGNOSIS — R079 Chest pain, unspecified: Secondary | ICD-10-CM | POA: Diagnosis present

## 2022-02-10 DIAGNOSIS — E663 Overweight: Secondary | ICD-10-CM | POA: Diagnosis not present

## 2022-02-10 DIAGNOSIS — I200 Unstable angina: Secondary | ICD-10-CM

## 2022-02-10 DIAGNOSIS — Z7984 Long term (current) use of oral hypoglycemic drugs: Secondary | ICD-10-CM | POA: Insufficient documentation

## 2022-02-10 DIAGNOSIS — I25110 Atherosclerotic heart disease of native coronary artery with unstable angina pectoris: Secondary | ICD-10-CM | POA: Diagnosis not present

## 2022-02-10 DIAGNOSIS — E876 Hypokalemia: Secondary | ICD-10-CM | POA: Diagnosis not present

## 2022-02-10 DIAGNOSIS — Z794 Long term (current) use of insulin: Secondary | ICD-10-CM | POA: Insufficient documentation

## 2022-02-10 DIAGNOSIS — I110 Hypertensive heart disease with heart failure: Secondary | ICD-10-CM | POA: Diagnosis not present

## 2022-02-10 DIAGNOSIS — Z87891 Personal history of nicotine dependence: Secondary | ICD-10-CM | POA: Diagnosis not present

## 2022-02-10 DIAGNOSIS — E785 Hyperlipidemia, unspecified: Secondary | ICD-10-CM | POA: Diagnosis not present

## 2022-02-10 DIAGNOSIS — I2510 Atherosclerotic heart disease of native coronary artery without angina pectoris: Secondary | ICD-10-CM | POA: Diagnosis present

## 2022-02-10 LAB — BASIC METABOLIC PANEL
Anion gap: 10 (ref 5–15)
BUN: 16 mg/dL (ref 6–20)
CO2: 19 mmol/L — ABNORMAL LOW (ref 22–32)
Calcium: 9.1 mg/dL (ref 8.9–10.3)
Chloride: 109 mmol/L (ref 98–111)
Creatinine, Ser: 0.91 mg/dL (ref 0.61–1.24)
GFR, Estimated: >60 mL/min (ref >60)
Glucose, Bld: 198 mg/dL — ABNORMAL HIGH (ref 70–99)
Potassium: 3.4 mmol/L — ABNORMAL LOW (ref 3.5–5.1)
Sodium: 138 mmol/L (ref 135–145)

## 2022-02-10 LAB — HIV ANTIBODY (ROUTINE TESTING W REFLEX): HIV Screen 4th Generation wRfx: NONREACTIVE

## 2022-02-10 LAB — TROPONIN I (HIGH SENSITIVITY)
Troponin I (High Sensitivity): <2 ng/L (ref <18)
Troponin I (High Sensitivity): 2 ng/L (ref <18)
Troponin I (High Sensitivity): <2 ng/L (ref <18)

## 2022-02-10 LAB — CBC
HCT: 44.8 % (ref 39.0–52.0)
Hemoglobin: 15.4 g/dL (ref 13.0–17.0)
MCH: 29.3 pg (ref 26.0–34.0)
MCHC: 34.4 g/dL (ref 30.0–36.0)
MCV: 85.3 fL (ref 80.0–100.0)
Platelets: 208 10*3/uL (ref 150–400)
RBC: 5.25 MIL/uL (ref 4.22–5.81)
RDW: 12.3 % (ref 11.5–15.5)
WBC: 7.9 10*3/uL (ref 4.0–10.5)
nRBC: 0 % (ref 0.0–0.2)

## 2022-02-10 LAB — URINE DRUG SCREEN, QUALITATIVE (ARMC ONLY)
Amphetamines, Ur Screen: NOT DETECTED
Barbiturates, Ur Screen: NOT DETECTED
Benzodiazepine, Ur Scrn: NOT DETECTED
Cannabinoid 50 Ng, Ur ~~LOC~~: NOT DETECTED
Cocaine Metabolite,Ur ~~LOC~~: NOT DETECTED
MDMA (Ecstasy)Ur Screen: NOT DETECTED
Methadone Scn, Ur: NOT DETECTED
Opiate, Ur Screen: NOT DETECTED
Phencyclidine (PCP) Ur S: NOT DETECTED
Tricyclic, Ur Screen: NOT DETECTED

## 2022-02-10 LAB — HEMOGLOBIN A1C
Hgb A1c MFr Bld: 10.2 % — ABNORMAL HIGH (ref 4.8–5.6)
Mean Plasma Glucose: 246 mg/dL

## 2022-02-10 LAB — CBG MONITORING, ED
Glucose-Capillary: 184 mg/dL — ABNORMAL HIGH (ref 70–99)
Glucose-Capillary: 150 mg/dL — ABNORMAL HIGH (ref 70–99)
Glucose-Capillary: 270 mg/dL — ABNORMAL HIGH (ref 70–99)
Glucose-Capillary: 325 mg/dL — ABNORMAL HIGH (ref 70–99)

## 2022-02-10 LAB — MAGNESIUM: Magnesium: 1.9 mg/dL (ref 1.7–2.4)

## 2022-02-10 MED ORDER — LISINOPRIL 5 MG PO TABS
2.5000 mg | ORAL_TABLET | Freq: Every day | ORAL | Status: DC
  Administered 2022-02-10 – 2022-02-11 (×2): 2.5 mg via ORAL
  Filled 2022-02-10 (×2): qty 1

## 2022-02-10 MED ORDER — ASPIRIN 81 MG PO CHEW
324.0000 mg | CHEWABLE_TABLET | Freq: Once | ORAL | Status: AC
  Administered 2022-02-10: 324 mg via ORAL
  Filled 2022-02-10: qty 4

## 2022-02-10 MED ORDER — ASPIRIN 81 MG PO CHEW
81.0000 mg | CHEWABLE_TABLET | Freq: Every day | ORAL | Status: DC
  Administered 2022-02-11: 81 mg via ORAL
  Filled 2022-02-10: qty 1

## 2022-02-10 MED ORDER — PERFLUTREN LIPID MICROSPHERE
1.0000 mL | INTRAVENOUS | Status: AC | PRN
  Administered 2022-02-10: 2 mL via INTRAVENOUS

## 2022-02-10 MED ORDER — FENTANYL CITRATE PF 50 MCG/ML IJ SOSY: 25.0000 ug | PREFILLED_SYRINGE | INTRAMUSCULAR | Status: DC | PRN

## 2022-02-10 MED ORDER — POTASSIUM CHLORIDE CRYS ER 20 MEQ PO TBCR
40.0000 meq | EXTENDED_RELEASE_TABLET | Freq: Once | ORAL | Status: AC
  Administered 2022-02-10: 40 meq via ORAL
  Filled 2022-02-10: qty 2

## 2022-02-10 MED ORDER — ACETAMINOPHEN 650 MG RE SUPP: 650.0000 mg | Freq: Four times a day (QID) | RECTAL | Status: DC | PRN

## 2022-02-10 MED ORDER — ACETAMINOPHEN 325 MG PO TABS
650.0000 mg | ORAL_TABLET | Freq: Four times a day (QID) | ORAL | Status: DC | PRN
  Administered 2022-02-10: 650 mg via ORAL
  Filled 2022-02-10: qty 2

## 2022-02-10 MED ORDER — MORPHINE SULFATE (PF) 2 MG/ML IV SOLN: 2.0000 mg | INTRAVENOUS | Status: DC | PRN

## 2022-02-10 MED ORDER — INSULIN ASPART 100 UNIT/ML IJ SOLN
0.0000 [IU] | Freq: Every day | INTRAMUSCULAR | Status: DC
  Administered 2022-02-10: 4 [IU] via SUBCUTANEOUS
  Filled 2022-02-10: qty 1

## 2022-02-10 MED ORDER — SODIUM CHLORIDE 0.9 % IV SOLN: INTRAVENOUS | Status: DC

## 2022-02-10 MED ORDER — CYCLOBENZAPRINE HCL 10 MG PO TABS
10.0000 mg | ORAL_TABLET | Freq: Three times a day (TID) | ORAL | Status: DC | PRN
  Filled 2022-02-10: qty 1

## 2022-02-10 MED ORDER — INSULIN DETEMIR 100 UNIT/ML ~~LOC~~ SOLN
50.0000 [IU] | Freq: Two times a day (BID) | SUBCUTANEOUS | Status: DC
Start: 2022-02-10 — End: 2022-02-10
  Filled 2022-02-10: qty 0.5

## 2022-02-10 MED ORDER — ISOSORBIDE MONONITRATE ER 60 MG PO TB24
60.0000 mg | ORAL_TABLET | Freq: Every day | ORAL | Status: DC
  Administered 2022-02-10 – 2022-02-11 (×2): 60 mg via ORAL
  Filled 2022-02-10 (×2): qty 1

## 2022-02-10 MED ORDER — ATORVASTATIN CALCIUM 20 MG PO TABS
40.0000 mg | ORAL_TABLET | Freq: Every day | ORAL | Status: DC
  Administered 2022-02-10: 40 mg via ORAL
  Filled 2022-02-10: qty 2

## 2022-02-10 MED ORDER — SODIUM CHLORIDE 0.9 % IV BOLUS
500.0000 mL | Freq: Once | INTRAVENOUS | Status: AC
  Administered 2022-02-10: 500 mL via INTRAVENOUS

## 2022-02-10 MED ORDER — MAGNESIUM HYDROXIDE 400 MG/5ML PO SUSP
30.0000 mL | Freq: Every day | ORAL | Status: DC | PRN
Start: 2022-02-10 — End: 2022-02-11

## 2022-02-10 MED ORDER — GABAPENTIN 100 MG PO CAPS
100.0000 mg | ORAL_CAPSULE | Freq: Two times a day (BID) | ORAL | Status: DC
  Administered 2022-02-10 – 2022-02-11 (×2): 100 mg via ORAL
  Filled 2022-02-10 (×2): qty 1

## 2022-02-10 MED ORDER — ONDANSETRON HCL 4 MG/2ML IJ SOLN: 4.0000 mg | Freq: Four times a day (QID) | INTRAMUSCULAR | Status: DC | PRN

## 2022-02-10 MED ORDER — FAMOTIDINE 20 MG PO TABS
20.0000 mg | ORAL_TABLET | Freq: Two times a day (BID) | ORAL | Status: DC
  Administered 2022-02-10 – 2022-02-11 (×2): 20 mg via ORAL
  Filled 2022-02-10 (×2): qty 1

## 2022-02-10 MED ORDER — HYDRALAZINE HCL 20 MG/ML IJ SOLN
5.0000 mg | INTRAMUSCULAR | Status: DC | PRN
Start: 2022-02-10 — End: 2022-02-11

## 2022-02-10 MED ORDER — ENOXAPARIN SODIUM 40 MG/0.4ML IJ SOSY
40.0000 mg | PREFILLED_SYRINGE | INTRAMUSCULAR | Status: DC
  Administered 2022-02-10: 40 mg via SUBCUTANEOUS
  Filled 2022-02-10: qty 0.4

## 2022-02-10 MED ORDER — NITROGLYCERIN 2 % TD OINT
1.0000 [in_us] | TOPICAL_OINTMENT | Freq: Once | TRANSDERMAL | Status: AC
Start: 2022-02-10 — End: 2022-02-10
  Administered 2022-02-10: 1 [in_us] via TOPICAL
  Filled 2022-02-10: qty 1

## 2022-02-10 MED ORDER — NITROGLYCERIN 0.4 MG SL SUBL: 0.4000 mg | SUBLINGUAL_TABLET | SUBLINGUAL | Status: DC | PRN

## 2022-02-10 MED ORDER — INSULIN DETEMIR 100 UNIT/ML ~~LOC~~ SOLN
60.0000 [IU] | Freq: Two times a day (BID) | SUBCUTANEOUS | Status: DC
  Administered 2022-02-11 (×2): 60 [IU] via SUBCUTANEOUS
  Filled 2022-02-10 (×3): qty 0.6

## 2022-02-10 MED ORDER — ONDANSETRON HCL 4 MG PO TABS: 4.0000 mg | ORAL_TABLET | Freq: Four times a day (QID) | ORAL | Status: DC | PRN

## 2022-02-10 MED ORDER — INSULIN ASPART 100 UNIT/ML IJ SOLN
0.0000 [IU] | Freq: Three times a day (TID) | INTRAMUSCULAR | Status: DC
  Administered 2022-02-10: 2 [IU] via SUBCUTANEOUS
  Administered 2022-02-10: 1 [IU] via SUBCUTANEOUS
  Administered 2022-02-10: 5 [IU] via SUBCUTANEOUS
  Administered 2022-02-11: 2 [IU] via SUBCUTANEOUS
  Filled 2022-02-10 (×4): qty 1

## 2022-02-10 NOTE — Assessment & Plan Note (Addendum)
Urine drug screen unremarkable.  Chest pain resolved.  Troponin x3 and EKG unremarkable.  Seen by cardiology and cleared.  Chest pain felt to be noncardiac in nature.

## 2022-02-10 NOTE — Assessment & Plan Note (Addendum)
Recent A1c 7.1, Patient still taking Jardiance, Levemir 50 units twice daily.  He stated that he had been taken off of Janumet -Sliding scale insulin during hospitalization

## 2022-02-10 NOTE — ED Provider Notes (Signed)
[facility] Provider Note    Event Date/Time   First MD Initiated Contact with Patient 02/10/22 0424     (approximate)   History   Chest Pain   HPI  Miguel Walters is a 56 y.o. male who presents to the ED from home with a chief complaint of chest tightness, sweating and shortness of breath.  Patient with a history of diabetes, CAD who reports waxing/waning symptoms x2 weeks.  Symptoms increased in intensity and frequency tonight.  Endorses nonradiating central chest tightness associated with diaphoresis and shortness of breath.  Denies palpitations, nausea/vomiting or dizziness.  Denies abdominal pain or diarrhea.  Denies recent travel, trauma or hormone use.  Reports medication compliance.  Started on Januvia 2 weeks ago.     Past Medical History   Past Medical History:  Diagnosis Date   Anginal pain (HCC)    Diabetes mellitus without complication (HCC)    Elevated lipids    H. pylori infection    Heart murmur    Hyperlipemia    Hypertension    Keloid    Multiple gastric ulcers    Murmur    Sleep apnea      Active Problem List   Patient Active Problem List   Diagnosis Date Noted   Chest pain 02/10/2022   Atypical chest pain 07/02/2020   Hypertension 07/02/2020   Murmur 07/02/2020   Plantar fasciitis, left 07/30/2014   Hypomagnesemia 01/27/2014   Leg cramp 01/27/2014   Diabetes mellitus (HCC) 11/22/2013   Hyperlipidemia 11/22/2013   OSA (obstructive sleep apnea) 11/22/2013   Keloid 11/08/2013     Past Surgical History   Past Surgical History:  Procedure Laterality Date   COLONOSCOPY WITH PROPOFOL N/A 11/08/2017   Procedure: COLONOSCOPY WITH PROPOFOL;  Surgeon: Scot Jun, MD;  Location: [facility] ENDOSCOPY;  Service: Endoscopy;  Laterality: N/A;   FRACTURE SURGERY     LEFT HEART CATH AND CORONARY ANGIOGRAPHY Left 10/10/2016   Procedure: Left Heart Cath and Coronary Angiography;  Surgeon: Alwyn Pea, MD;  Location: [facility]  INVASIVE CV LAB;  Service: Cardiovascular;  Laterality: Left;     Home Medications   Prior to Admission medications   Medication Sig Start Date End Date Taking? Authorizing Provider  atorvastatin (LIPITOR) 40 MG tablet Take 40 mg by mouth at bedtime. 05/11/20   [provider]  clotrimazole-betamethasone (LOTRISONE) cream Apply 1 application topically 2 (two) times daily. 07/17/20   Felecia Shelling, DPM  cyclobenzaprine (FLEXERIL) 10 MG tablet Take 1 tablet (10 mg total) by mouth 3 (three) times daily as needed for muscle spasms. 03/31/18   Cuthriell, Delorise Royals, PA-C  famotidine (PEPCID) 20 MG tablet Take 1 tablet (20 mg total) by mouth 2 (two) times daily. 06/06/20   Sharman Cheek, MD  gabapentin (NEURONTIN) 100 MG capsule gabapentin 100 mg capsule  TAKE ONE CAPSULE BY MOUTH ONCE DAILY & TAKE 1 TO 2 CAPSULES AT BEDTIME 09/05/18   [provider]  insulin detemir (LEVEMIR) 100 UNIT/ML injection Inject 80 Units into the skin 2 (two) times daily.     [provider]  JARDIANCE 10 MG TABS tablet Take 10 mg by mouth daily. 06/04/17   [provider]  lisinopril (ZESTRIL) 2.5 MG tablet lisinopril 2.5 mg tablet  TAKE ONE TABLET BY MOUTH ONCE DAILY FOR KIDNEY PROTECTION 08/24/18   [provider]  omeprazole (PRILOSEC) 20 MG capsule Take 20 mg by mouth daily.    [provider]  SitaGLIPtin-MetFORMIN HCl (JANUMET XR) 50-1000 MG TB24 Take 2 tablets by mouth daily with supper.    [provider]  terbinafine (LAMISIL) 250 MG tablet Take 1 tablet (250 mg total) by mouth daily. 07/17/20   Felecia Shelling, DPM     Allergies  Codeine, Oxycodone, and Hydrocodone   Family History  History reviewed. No pertinent family history.   Physical Exam  Triage Vital Signs: ED Triage Vitals  Enc Vitals Group     BP 02/10/22 0259 118/78     Pulse Rate 02/10/22 0259 89     Resp 02/10/22 0259 18     Temp 02/10/22 0259 98.5 F (36.9 C)     Temp  Source 02/10/22 0259 Oral     SpO2 02/10/22 0259 97 %     Weight --      Height --      Head Circumference --      Peak Flow --      Pain Score 02/10/22 0259 7     Pain Loc --      Pain Edu? --      Excl. in GC? --     Updated Vital Signs: BP 118/78 (BP Location: Left Arm)   Pulse 89   Temp 98.5 F (36.9 C) (Oral)   Resp 18   SpO2 97%    General: Awake, no distress.  CV:  RRR.  Good peripheral perfusion.  Resp:  Normal effort.  CTA B. Abd:  Nontender.  No distention.  Other:  Calves are nontender and nonswollen.   ED Results / Procedures / Treatments  Labs (all labs ordered are listed, but only abnormal results are displayed) Labs Reviewed  BASIC METABOLIC PANEL - Abnormal; Notable for the following components:      Result Value   Potassium 3.4 (*)    CO2 19 (*)    Glucose, Bld 198 (*)    All other components within normal limits  CBC  TROPONIN I (HIGH SENSITIVITY)  TROPONIN I (HIGH SENSITIVITY)     EKG  ED ECG REPORT I, Flower Franko J, the attending physician, personally viewed and interpreted this ECG.   Date: 02/10/2022  EKG Time: 0302  Rate: 97  Rhythm: normal sinus rhythm  Axis: Normal  Intervals:none  ST&T Change: Nonspecific    RADIOLOGY I have independently visualized and interpreted patient's chest x-ray as well as noted the radiology interpretation:  Chest x-ray: No acute cardiopulmonary process  Official radiology report(s): DG Chest 2 View  Result Date: 02/10/2022 CLINICAL DATA:  Chest pain, chest tightness, shortness of breath. EXAM: CHEST - 2 VIEW COMPARISON:  09/11/2021 FINDINGS: Normal heart size and pulmonary vascularity. No focal airspace disease or consolidation in the lungs. No blunting of costophrenic angles. No pneumothorax. Mediastinal contours appear intact. Mild pectus excavatum. IMPRESSION: No active cardiopulmonary disease. Electronically Signed   By: Burman Nieves M.D.   On: 02/10/2022 03:31     PROCEDURES:  Critical  Care performed: Yes, see critical care procedure note(s)  CRITICAL CARE Performed by: Irean Hong   Total critical care time: 30 minutes  Critical care time was exclusive of separately billable procedures and treating other patients.  Critical care was necessary to treat or prevent imminent or life-threatening deterioration.  Critical care was time spent personally by me on the following activities: development of treatment plan with patient and/or surrogate as well as nursing, discussions with consultants, evaluation of patient's response to treatment, examination of patient, obtaining history from patient or surrogate,  ordering and performing treatments and interventions, ordering and review of laboratory studies, ordering and review of radiographic studies, pulse oximetry and re-evaluation of patient's condition.   Marland Kitchen1-3 Lead EKG Interpretation  Performed by: Irean Hong, MD Authorized by: Irean Hong, MD     Interpretation: normal     ECG rate:  95   ECG rate assessment: normal     Rhythm: sinus rhythm     Ectopy: none     Conduction: normal   Comments:     Patient placed on cardiac monitor to evaluate for arrhythmias    MEDICATIONS ORDERED IN ED: Medications  sodium chloride 0.9 % bolus 500 mL (has no administration in time range)  aspirin chewable tablet 324 mg (324 mg Oral Given 02/10/22 0453)  nitroGLYCERIN (NITROGLYN) 2 % ointment 1 inch (1 inch Topical Given 02/10/22 0454)     IMPRESSION / MDM / ASSESSMENT AND PLAN / ED COURSE  I reviewed the triage vital signs and the nursing notes.                             57 year old male presenting with chest pain, diaphoresis and shortness of breath. Differential diagnosis includes, but is not limited to, ACS, aortic dissection, pulmonary embolism, cardiac tamponade, pneumothorax, pneumonia, pericarditis, myocarditis, GI-related causes including esophagitis/gastritis, and musculoskeletal chest wall pain.   I have personally  reviewed patient's records and see that he had a cardiology office visit on 11/23/2021.  Patient's presentation is most consistent with acute complicated illness / injury requiring diagnostic workup.  The patient is on the cardiac monitor to evaluate for evidence of arrhythmia and/or significant heart rate changes.  Laboratory results demonstrate normal WBC 7.9, normal electrolytes, negative initial troponin and EKG.  Chest x-ray unremarkable.  Given patient's history of CAD and increasing frequency and intensity of symptoms, this is concerning for unstable angina.  Will administer aspirin and nitroglycerin paste.  Administer gentle IV fluid hydration as patient feels generally weak.  Will consult hospitalist services for evaluation and admission.      FINAL CLINICAL IMPRESSION(S) / ED DIAGNOSES   Final diagnoses:  Unstable angina (HCC)     Rx / DC Orders   ED Discharge Orders     None        Note:  This document was prepared using Dragon voice recognition software and may include unintentional dictation errors.   Irean Hong, MD 02/10/22 [phone]

## 2022-02-10 NOTE — H&P (Signed)
History and Physical    Miguel Walters:[id number] DOB: [birth year] DOA: 02/10/2022  Referring MD/NP/PA:   PCP: Shane Crutch, PA   Patient coming from:  The patient is coming from home.  At baseline, pt is independent for most of ADL.        Chief Complaint: chest pain  HPI: Miguel Walters is a 56 y.o. male with medical history significant of HTN, HLD, CAD, DM, OSA not using CPAP, GERD, who presents with chest pain.  Patient has intermittent chest pain for more than 1 week, which has worsened since last night.  His chest pain is located in substernal area, 10 out of 10 in severity initially, currently 6 out of 10 in severity, pressure-like, nonradiating. Associated with shortness of breath and diaphoresis.  No cough, fever or chills.  No nausea vomiting, diarrhea or abdominal pain.  No symptoms of UTI.  Data Reviewed and ED Course: pt was found to have troponin level 2 --> 2, WBC 7.9, GFR> 60, potassium 3.4, temperature normal, blood pressure 97/69, heart rate 91, RR 19, oxygen saturation 98% on room air.  Chest x-ray negative.  Patient is placed on telemetry bed for position. Dr. Darrold Junker of cardiology is consulted.   EKG: I have personally reviewed.  Sinus rhythm, QTc 487, LAE, poor R wave progression, nonspecific T wave change.  Review of Systems:   General: no fevers, chills, no body weight gain, has fatigue HEENT: no blurry vision, hearing changes or sore throat Respiratory: no dyspnea, coughing, wheezing CV: has chest pain, no palpitations GI: no nausea, vomiting, abdominal pain, diarrhea, constipation GU: no dysuria, burning on urination, increased urinary frequency, hematuria  Ext: no leg edema Neuro: no unilateral weakness, numbness, or tingling, no vision change or hearing loss Skin: no rash, no skin tear. MSK: No muscle spasm, no deformity, no limitation of range of movement in spin Heme: No easy bruising.  Travel history: No recent long distant  travel.   Allergy:  Allergies  Allergen Reactions   Codeine    Oxycodone Itching   Hydrocodone Rash    Past Medical History:  Diagnosis Date   Anginal pain (HCC)    Diabetes mellitus without complication (HCC)    Elevated lipids    H. pylori infection    Heart murmur    Hyperlipemia    Hypertension    Keloid    Multiple gastric ulcers    Murmur    Sleep apnea     Past Surgical History:  Procedure Laterality Date   COLONOSCOPY WITH PROPOFOL N/A 11/08/2017   Procedure: COLONOSCOPY WITH PROPOFOL;  Surgeon: Scot Jun, MD;  Location: [facility] ENDOSCOPY;  Service: Endoscopy;  Laterality: N/A;   FRACTURE SURGERY     LEFT HEART CATH AND CORONARY ANGIOGRAPHY Left 10/10/2016   Procedure: Left Heart Cath and Coronary Angiography;  Surgeon: Alwyn Pea, MD;  Location: [facility] INVASIVE CV LAB;  Service: Cardiovascular;  Laterality: Left;    Social History:  reports that he quit smoking about 14 years ago. He has never used smokeless tobacco. He reports that he does not drink alcohol and does not use drugs.  Family History:  Family History  Problem Relation Age of Onset   Liver disease Mother    Diabetes Brother      Prior to Admission medications   Medication Sig Start Date End Date Taking? Authorizing Provider  atorvastatin (LIPITOR) 40 MG tablet Take 40 mg by mouth at bedtime. 05/11/20   [provider]  clotrimazole-betamethasone (LOTRISONE) cream Apply 1 application topically 2 (two) times daily. 07/17/20   Felecia Shelling, DPM  cyclobenzaprine (FLEXERIL) 10 MG tablet Take 1 tablet (10 mg total) by mouth 3 (three) times daily as needed for muscle spasms. 03/31/18   Cuthriell, Delorise Royals, PA-C  famotidine (PEPCID) 20 MG tablet Take 1 tablet (20 mg total) by mouth 2 (two) times daily. 06/06/20   Sharman Cheek, MD  gabapentin (NEURONTIN) 100 MG capsule gabapentin 100 mg capsule  TAKE ONE CAPSULE BY MOUTH ONCE DAILY & TAKE 1 TO 2 CAPSULES AT BEDTIME 09/05/18    [provider]  insulin detemir (LEVEMIR) 100 UNIT/ML injection Inject 80 Units into the skin 2 (two) times daily.     [provider]  JARDIANCE 10 MG TABS tablet Take 10 mg by mouth daily. 06/04/17   [provider]  lisinopril (ZESTRIL) 2.5 MG tablet lisinopril 2.5 mg tablet  TAKE ONE TABLET BY MOUTH ONCE DAILY FOR KIDNEY PROTECTION 08/24/18   [provider]  omeprazole (PRILOSEC) 20 MG capsule Take 20 mg by mouth daily.    [provider]  SitaGLIPtin-MetFORMIN HCl (JANUMET XR) 50-1000 MG TB24 Take 2 tablets by mouth daily with supper.    [provider]  terbinafine (LAMISIL) 250 MG tablet Take 1 tablet (250 mg total) by mouth daily. 07/17/20   Felecia Shelling, DPM    Physical Exam: Vitals:   02/10/22 1300 02/10/22 1500 02/10/22 1600 02/10/22 1630  BP: 97/72 102/67 106/68   Pulse: 72 65 78 80  Resp: 15 13 15 17   Temp:      TempSrc:      SpO2: 95% 93% (!) 86% 94%   General: Not in acute distress HEENT:       Eyes: PERRL, EOMI, no scleral icterus.       ENT: No discharge from the ears and nose, no pharynx injection, no tonsillar enlargement.        Neck: No JVD, no bruit, no mass felt. Heme: No neck lymph node enlargement. Cardiac: S1/S2, RRR, No gallops or rubs. Respiratory: No rales, wheezing, rhonchi or rubs. GI: Soft, nondistended, nontender, no rebound pain, no organomegaly, BS present. GU: No hematuria Ext: No pitting leg edema bilaterally. 1+DP/PT pulse bilaterally. Musculoskeletal: No joint deformities, No joint redness or warmth, no limitation of ROM in spin. Skin: No rashes.  Neuro: Alert, oriented X3, cranial nerves II-XII grossly intact, moves all extremities normally.  Psych: Patient is not psychotic, no suicidal or hemocidal ideation.  Labs on Admission: I have personally reviewed following labs and imaging studies  CBC: Recent Labs  Lab 02/10/22 0301  WBC 7.9  HGB 15.4  HCT 44.8  MCV 85.3  PLT 208    Basic Metabolic Panel: Recent Labs  Lab 02/10/22 0301 02/10/22 0500  NA 138  --   K 3.4*  --   CL 109  --   CO2 19*  --   GLUCOSE 198*  --   BUN 16  --   CREATININE 0.91  --   CALCIUM 9.1  --   MG  --  1.9   GFR: CrCl cannot be calculated (Unknown ideal weight.). Liver Function Tests: No results for input(s): "AST", "ALT", "ALKPHOS", "BILITOT", "PROT", "ALBUMIN" in the last 168 hours. No results for input(s): "LIPASE", "AMYLASE" in the last 168 hours. No results for input(s): "AMMONIA" in the last 168 hours. Coagulation Profile: No results for input(s): "INR", "PROTIME" in the last 168 hours. Cardiac Enzymes: No results  for input(s): "CKTOTAL", "CKMB", "CKMBINDEX", "TROPONINI" in the last 168 hours. BNP (last 3 results) No results for input(s): "PROBNP" in the last 8760 hours. HbA1C: No results for input(s): "HGBA1C" in the last 72 hours. CBG: Recent Labs  Lab 02/10/22 0813 02/10/22 1112 02/10/22 1659  GLUCAP 184* 150* 270*   Lipid Profile: No results for input(s): "CHOL", "HDL", "LDLCALC", "TRIG", "CHOLHDL", "LDLDIRECT" in the last 72 hours. Thyroid Function Tests: No results for input(s): "TSH", "T4TOTAL", "FREET4", "T3FREE", "THYROIDAB" in the last 72 hours. Anemia Panel: No results for input(s): "VITAMINB12", "FOLATE", "FERRITIN", "TIBC", "IRON", "RETICCTPCT" in the last 72 hours. Urine analysis:    Component Value Date/Time   COLORURINE YELLOW (A) 11/14/2021 1005   APPEARANCEUR CLEAR (A) 11/14/2021 1005   APPEARANCEUR Clear 01/10/2018 1340   LABSPEC 1.030 11/14/2021 1005   LABSPEC 1.020 05/23/2014 1016   PHURINE 6.0 11/14/2021 1005   GLUCOSEU >=500 (A) 11/14/2021 1005   GLUCOSEU Negative 05/23/2014 1016   HGBUR NEGATIVE 11/14/2021 1005   BILIRUBINUR NEGATIVE 11/14/2021 1005   BILIRUBINUR Negative 01/10/2018 1340   BILIRUBINUR Negative 05/23/2014 1016   KETONESUR NEGATIVE 11/14/2021 1005   PROTEINUR NEGATIVE 11/14/2021 1005   NITRITE NEGATIVE  11/14/2021 1005   LEUKOCYTESUR NEGATIVE 11/14/2021 1005   LEUKOCYTESUR Negative 05/23/2014 1016   Sepsis Labs: @LABRCNTIP (procalcitonin:4,lacticidven:4) )No results found for this or any previous visit (from the past 240 hour(s)).   Radiological Exams on Admission: DG Chest 2 View  Result Date: 02/10/2022 CLINICAL DATA:  Chest pain, chest tightness, shortness of breath. EXAM: CHEST - 2 VIEW COMPARISON:  09/11/2021 FINDINGS: Normal heart size and pulmonary vascularity. No focal airspace disease or consolidation in the lungs. No blunting of costophrenic angles. No pneumothorax. Mediastinal contours appear intact. Mild pectus excavatum. IMPRESSION: No active cardiopulmonary disease. Electronically Signed   By: 09/13/2021 M.D.   On: 02/10/2022 03:31      Assessment/Plan Principal Problem:   Chest pain Active Problems:   CAD (coronary artery disease)   Hypertension   Diabetes mellitus without complication (HCC)   Hypokalemia   Hyperlipidemia   Assessment and Plan: * Chest pain Chest pain and hx of CAD: trop 2 --> 2 --> 2. Consulted Dr. 02/12/2022 of cardiology  - place to tele bed for observation - prn Nitroglycerin, Morphine, and aspirin, lipitor  - increased Imdur dose from 30 to 60 mg daily - Risk factor stratification: will check FLP and A1C  - check UDS - 2d echo       CAD (coronary artery disease) -see above  Hypertension -IV hydralazine as needed -Lisinopril -Patient is also on Imdur 60 mg daily  Diabetes mellitus without complication (HCC) Recent A1c 7.1, poorly controlled.  Patient still taking Janumet, Jardiance, Levemir 50 units twice daily -Sliding scale insulin -Glargine insulin 30 units twice daily  Hypokalemia Potassium is 3.4 -Repleted potassium -Check magnesium level --> 1.9  Hyperlipidemia -lipitor             DVT ppx: SQ Lovenox  Code Status: Full code  Family Communication: I offered to call his family, but patient  states that I do not need to call his family since his wife was with him earlier and that she knows what is going on for him.  Disposition Plan:  Anticipate discharge back to previous environment  Consults called:   Dr. Darrold Junker of cardiology is consulted.  Admission status and Level of care: Telemetry Cardiac:     for obs    Severity of Illness:  The appropriate  patient status for this patient is OBSERVATION. Observation status is judged to be reasonable and necessary in order to provide the required intensity of service to ensure the patient's safety. The patient's presenting symptoms, physical exam findings, and initial radiographic and laboratory data in the context of their medical condition is felt to place them at decreased risk for further clinical deterioration. Furthermore, it is anticipated that the patient will be medically stable for discharge from the hospital within 2 midnights of admission.        Date of Service 02/10/2022    Lorretta Harp [facility]   If 7PM-7AM, please contact night-coverage [email or web address] 02/10/2022, 6:50 PM

## 2022-02-10 NOTE — ED Triage Notes (Signed)
Pt arrives with c/o Chest tightness and SOB. Pt denies n/v. Per pt, the symptoms started about a week ago and worsened tonight.

## 2022-02-10 NOTE — ED Notes (Signed)
Pt ambulated to restroom and back independently

## 2022-02-10 NOTE — Assessment & Plan Note (Addendum)
Continued on lisinopril and Imdur.  Recommendation was for increasing Imdur from 30 mg to 60 mg as well as adding beta-blocker but this was not able to be initiated due to soft blood pressure with systolic in the 90s.

## 2022-02-10 NOTE — Progress Notes (Signed)
Admission profile updated. ?

## 2022-02-10 NOTE — Consult Note (Signed)
[facility] CLINIC CARDIOLOGY CONSULT NOTE       Patient ID: Miguel Walters MRN: [id number] DOB/AGE: 1965/09/01 56 y.o.  Admit date: 02/10/2022 Referring Physician Dr. Clyde Lundborg Primary Physician  Primary Cardiologist Dr. Juliann Pares Reason for Consultation chest pain  HPI: Miguel Walters is a 55yoM with a PMH of insignificant CAD by [facility] 2018, HTN, hyperlipidemia, type 2 diabetes, OSA, GERd who presented to [facility] ED 02/10/2022 with chest pain.  Cardiology is consulted for further assistance.  Patient presents with his wife who contributes to the history.  He states that over the past 2 to 3 weeks he has had constant epigastric discomfort/substernal chest tightness, worsening over the past 2 days, associated with a sensation of breathlessness and diaphoresis.  He has been generally fatigued and weak feeling lately, going to bed 2 hours earlier than usual last night.  He says every single day he has feeling of pressure and tightness in his chest that does not radiate, is no worse with exertion, and is present at rest.  He had a single episode while I was present of a right-sided sharp pain that lasted seconds and terminated without intervention.  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  Pain does not change with positions, and he does have some occasional feet swelling that is no worse on exam today per his report.  He reports compliance with his medications, including aspirin and isosorbide.  Of note, he started on a new diabetes medicine Januvia 2 to 3 weeks ago and questions whether this could be causing his symptoms and also follow-up this discomfort could be gas pains, but wanted to come to the ER to be sure.  He had a recent Myoview in March-2023 showed normal myocardial perfusion without evidence of stress-induced myocardial ischemia, borderline LVEF 45-50%.  His vitals are notable for recent blood pressure of 112/74, heart rate 62 in sinus rhythm on  telemetry with occasional PVCs.  SPO2 96% on room air.   Labs are notable for potassium of 3.4, blood glucose elevated at 198.  BUN/creatinine 16/0.91, GFR greater than 60.  High-sensitivity troponin persistently negative on 3 repeats at < 2-2-<2.  EKG shows sinus rhythm rate 65 with premature atrial contractions that are new, otherwise nonischemic and similar to prior study.  Review of systems complete and found to be negative unless listed above     Past Medical History:  Diagnosis Date   Anginal pain (HCC)    Diabetes mellitus without complication (HCC)    Elevated lipids    H. pylori infection    Heart murmur    Hyperlipemia    Hypertension    Keloid    Multiple gastric ulcers    Murmur    Sleep apnea     Past Surgical History:  Procedure Laterality Date   COLONOSCOPY WITH PROPOFOL N/A 11/08/2017   Procedure: COLONOSCOPY WITH PROPOFOL;  Surgeon: Scot Jun, MD;  Location: [facility] ENDOSCOPY;  Service: Endoscopy;  Laterality: N/A;   FRACTURE SURGERY     LEFT HEART CATH AND CORONARY ANGIOGRAPHY Left 10/10/2016   Procedure: Left Heart Cath and Coronary Angiography;  Surgeon: Alwyn Pea, MD;  Location: [facility] INVASIVE CV LAB;  Service: Cardiovascular;  Laterality: Left;    (Not in a hospital admission)  Social History   Socioeconomic History   Marital status: Married    Spouse name: Not on file   Number of children: Not on file   Years of  education: Not on file   Highest education level: Not on file  Occupational History   Not on file  Tobacco Use   Smoking status: Former    Types: Cigarettes    Quit date: 02/23/2007    Years since quitting: 14.9   Smokeless tobacco: Never  Vaping Use   Vaping Use: Never used  Substance and Sexual Activity   Alcohol use: No   Drug use: No   Sexual activity: Not on file  Other Topics Concern   Not on file  Social History Narrative   Not on file   Social Determinants of Health   Financial Resource Strain: Not on file   Food Insecurity: Not on file  Transportation Needs: Not on file  Physical Activity: Not on file  Stress: Not on file  Social Connections: Not on file  Intimate Partner Violence: Not on file    History reviewed. No pertinent family history.    PHYSICAL EXAM General: Middle-aged black male, well nourished, in no acute distress.  Sitting upright in hospital bed with wife at bedside HEENT:  Normocephalic and atraumatic. Neck:  No JVD.  Lungs: Normal respiratory effort on room air. Clear bilaterally to auscultation. No wheezes, crackles, rhonchi.  Heart: HRRR . Normal S1 and S2 without gallops or murmurs. Radial & DP pulses 2+ bilaterally. Chest: Linear keloid scar across anterior chest, tenderness to palpation just inferior to the scarring, along the left chest wall Abdomen: Tenderness to palpation of the epigastrium, obese, soft, nontender otherwise to palpation Msk: Normal strength and tone for age. Extremities: Warm and well perfused. No clubbing, cyanosis.  No peripheral edema.  Neuro: Alert and oriented X 3. Psych:  Answers questions appropriately.   Labs:   Lab Results  Component Value Date   WBC 7.9 02/10/2022   HGB 15.4 02/10/2022   HCT 44.8 02/10/2022   MCV 85.3 02/10/2022   PLT 208 02/10/2022    Recent Labs  Lab 02/10/22 0301  NA 138  K 3.4*  CL 109  CO2 19*  BUN 16  CREATININE 0.91  CALCIUM 9.1  GLUCOSE 198*   Lab Results  Component Value Date   CKTOTAL 286 05/23/2014   CKMB 1.2 05/23/2014   TROPONINI <0.03 07/11/2017   No results found for: "CHOL" No results found for: "HDL" No results found for: "LDLCALC" No results found for: "TRIG" No results found for: "CHOLHDL" No results found for: "LDLDIRECT"    Radiology: DG Chest 2 View  Result Date: 02/10/2022 CLINICAL DATA:  Chest pain, chest tightness, shortness of breath. EXAM: CHEST - 2 VIEW COMPARISON:  09/11/2021 FINDINGS: Normal heart size and pulmonary vascularity. No focal airspace disease or  consolidation in the lungs. No blunting of costophrenic angles. No pneumothorax. Mediastinal contours appear intact. Mild pectus excavatum. IMPRESSION: No active cardiopulmonary disease. Electronically Signed   By: Burman Nieves M.D.   On: 02/10/2022 03:31     10/2021  Borderline left ventricular function but normal myocardial  perfusion scan no evidence of stress-induced myocardial ischemia ejection  fraction between 45 to 50% but no evidence of stress-induced ischemia  conclusion intermediate scan Narrative  Table formatting from the original result was not included.  CARDIOLOGY DEPARTMENT  [facility]  [facility]  [address] [place], [place]  [address]  [phone]   Procedure: Exercise Myocardial Perfusion Imaging    ONE day procedure   Indication: Intermittent chest pain  Plan: NM myocardial perfusion SPECT multiple (stress  and rest), ECG stress test only   Shortness of breath  Plan: NM myocardial perfusion SPECT multiple (stress        and rest), ECG stress test only   Ordering Physician:   Dr. Dorothyann Pengwayne Callwood    Clinical History:  56 y.o. year old male recent anginal symptoms  Vitals: Height: 67 in  Weight: 184 lb  Cardiac risk factors include:     Hyperlipidemia, Diabetes, HTN and CAD    Procedure:  The patient performed treadmill exercise using a Bruce protocol for 9:00  minutes. The exercise test was stopped due to fatigue.  Blood pressure  response was hypertensive. The patient did not develop any symptoms other  than fatigue during the procedure   Rest HR: 83bpm  Rest BP: 124/586mmHg  Max HR: 162bpm  Max BP: 196/15100mmHg  Mets:     10.10  % MAX HR:   97%   Stress Test Administered by: Percell BostonINA WALLACE, CMA   ECG Interpretation:  Rest ECG:  normal sinus rhythm, none  Stress ECG:  sinus tachycardia,  Recovery ECG:  normal sinus rhythm  ECG Interpretation:  negative, nondiagnostic changes.    Administrations This  Visit     technetium Tc278m sestamibi (CARDIOLITE) injection 10.93 millicurie     Admin Date  10/15/2021 Action  Given Dose  10.93 millicurie Route  Intravenous Administered By  Titus DubinGoard, Scott N, CNMT     technetium Tc3278m sestamibi (CARDIOLITE) injection 29.32 millicurie     Admin Date  10/15/2021 Action  Given Dose  29.32 millicurie Route  Intravenous Administered By  Jacqulyn BathMayton, Tiffani, CNMT       Gated post-stress perfusion imaging was performed 30 minutes after stress.  Rest images were performed 30 minutes after injection.   Gated LV Analysis:   Summary of LV Perfusion: Normal   Summary of LV Function: Normal   TID:  0.84   LVEF=45-50 %   FINDINGS:  Regional wall motion:  reveals normal myocardial thickening and wall  motion.  The overall quality of the study is good.    Artifacts noted: no  Left ventricular cavity: normal.   ECHO 10/2021 ECHOCARDIOGRAPHIC MEASUREMENTS  2D DIMENSIONS  AORTA                  Values   Normal Range   MAIN PA         Values    Normal Range                Annulus: 1.8 cm       [2.3-2.9]         PA Main: nm*       [1.5-2.1]              Aorta Sin: nm*          [3.1-3.7]    RIGHT VENTRICLE            ST Junction: nm*          [2.6-3.2]         RV Base: nm*       [<4.2]              Asc.Aorta: 2.4 cm       [2.6-3.4]          RV Mid: 2.5 cm    [<3.5]  LEFT VENTRICLE  RV Length: nm*       [<8.6]                  LVIDd: 5.3 cm       [4.2-5.9]    INFERIOR VENA CAVA                  LVIDs: 3.9 cm                        Max. IVC: nm*       [<=2.1]                     FS: 26.0 %       [>25]            Min. IVC: nm*                    SWT: 0.77 cm      [0.6-1.0]    ------------------                    PWT: 0.82 cm      [0.6-1.0]    nm* - not measured  LEFT ATRIUM                LA Diam: 3.5 cm       [3.0-4.0]            LA A4C Area: nm*          [<20]              LA Volume: nm*          [18-58]   _________________________________________________________________________________________  ECHOCARDIOGRAPHIC DESCRIPTIONS  AORTIC ROOT                   Size: Normal             Dissection: INDETERM FOR DISSECTION  AORTIC VALVE               Leaflets: Tricuspid                   Morphology: Normal               Mobility: Fully mobile  LEFT VENTRICLE                   Size: Normal                        Anterior: Normal            Contraction: Normal                         Lateral: Normal             Closest EF: 50% (Estimated)                 Septal: Normal              LV Masses: No Masses                       Apical: Normal                    LVH: None                          Inferior: Normal  Posterior: Normal           Dias.FxClass: N/A  MITRAL VALVE               Leaflets: Normal                        Mobility: Fully mobile             Morphology: Normal  LEFT ATRIUM                   Size: Normal                       LA Masses: No masses              IA Septum: Normal IAS  MAIN PA                   Size: Normal  PULMONIC VALVE             Morphology: Normal                        Mobility: Fully mobile  RIGHT VENTRICLE              RV Masses: No Masses                         Size: Normal              Free Wall: Normal                     Contraction: Normal  TRICUSPID VALVE               Leaflets: Normal                        Mobility: Fully mobile             Morphology: Normal  RIGHT ATRIUM                   Size: Normal                        RA Other: None                RA Mass: No masses  PERICARDIUM                  Fluid: No effusion  INFERIOR VENACAVA                   Size: Normal Normal respiratory collapse  _________________________________________________________________________________________   DOPPLER ECHO and OTHER SPECIAL PROCEDURES                 Aortic: TRIVIAL AR                 No AS                          130.0 cm/sec peak vel      6.8 mmHg peak grad                 Mitral: TRIVIAL MR                 No MS  MV Inflow E Vel = 104.0 cm/sec      MV Annulus E'Vel = 8.7 cm/sec                         E/E'Ratio = 12.0              Tricuspid: TRIVIAL TR                 No TS                         233.0 cm/sec peak TR vel   24.7 mmHg peak RV pressure              Pulmonary: MILD PR                    No PS                         124.0 cm/sec peak vel      6.2 mmHg peak grad  _________________________________________________________________________________________  INTERPRETATION  NORMAL LEFT VENTRICULAR SYSTOLIC FUNCTION  NORMAL RIGHT VENTRICULAR SYSTOLIC FUNCTION  NO VALVULAR STENOSIS  TRIVIAL AR, MR, TR  MILD PR  EF 50%   TELEMETRY reviewed by me: Sinus rhythm rate 70s PVCS  EKG reviewed by me: Sinus rhythm with premature atrial contractions, rate 65, poor R wave progression.  ASSESSMENT AND PLAN:  Brynn Reznik is a 66yoM with a PMH of insignificant CAD by [facility] 2018, HTN, hyperlipidemia, type 2 diabetes, OSA, GERd who presented to [facility] ED 02/10/2022 with chest pain.  Cardiology is consulted for further assistance.  #Atypical chest pain #HFpEF #Insignificant CAD by [facility] 2018 The patient presents with a 2 to 3-week history of substernal/epigastric discomfort that the patient describes as constant, occurring with exertion and at rest, worsening over the past 2 days, associated with a sensation of transient breathlessness and diaphoresis.  He thinks this is similar to the discomfort he had earlier this year that was previously relieved by Imdur, although has not difficult time discerning whether this is gas pain.  He has difficulty characterizing the discomfort, and says its not relieved by anything, rated an 8 out of 10 at greatest intensity, and not really getting better than that.  His troponins are persistently negative at 3 repeats at <2-2-<2, and  EKG is without acute ischemic changes.  Notably, his pain is reproducible to palpation in the epigastrium and substernally, and on his left chest wall, suggesting that this discomfort is noncardiac in nature. -Consider other etiologies of patient's chest pain, possibly GI related, or related to starting Januvia 2 to 3 weeks ago when the pain started worsening per patient. -S/p 325 mg aspirin, continue 81 mg aspirin daily. -Defer heparin drip -Uptitrate isosorbide from 30 mg to 60 mg -Continue home medicines including lisinopril 2.5, Jardiance, atorvastatin 40 mg,, consider addition of beta-blocker with low-dose metoprolol with his blood pressure and heart rate allow -no further cardiac diagnostics necessary. Recommend close follow up with Dr. Juliann Pares 1 week after discharge.  This patient's plan of care was discussed and created with Dr. Darrold Junker and he is in agreement.  Signed: Rebeca Allegra , PA-C 02/10/2022, 9:52 AM [facility] Cardiology

## 2022-02-10 NOTE — ED Notes (Signed)
Sent Dr Clyde Lundborg secure chat to see if pt can have diet order

## 2022-02-10 NOTE — Assessment & Plan Note (Signed)
lipitor

## 2022-02-10 NOTE — Progress Notes (Signed)
*  PRELIMINARY RESULTS* Echocardiogram 2D Echocardiogram has been performed.  Joanette Gula Ketih Goodie 02/10/2022, 1:17 PM

## 2022-02-10 NOTE — Assessment & Plan Note (Signed)
-  see above 

## 2022-02-10 NOTE — Assessment & Plan Note (Signed)
Potassium is 3.4 -Repleted potassium -Check magnesium level --> 1.9

## 2022-02-11 DIAGNOSIS — E663 Overweight: Secondary | ICD-10-CM | POA: Diagnosis present

## 2022-02-11 LAB — ECHOCARDIOGRAM COMPLETE
AR max vel: 2.05 cm2
AV Area VTI: 2.16 cm2
AV Area mean vel: 2.05 cm2
AV Mean grad: 3 mmHg
AV Peak grad: 5.3 mmHg
Ao pk vel: 1.15 m/s
Area-P 1/2: 4.33 cm2
Calc EF: 62.6 %
S' Lateral: 3.4 cm
Single Plane A2C EF: 57.3 %
Single Plane A4C EF: 61.2 %

## 2022-02-11 LAB — CBC
HCT: 38.9 % — ABNORMAL LOW (ref 39.0–52.0)
Hemoglobin: 13.3 g/dL (ref 13.0–17.0)
MCH: 29.3 pg (ref 26.0–34.0)
MCHC: 34.2 g/dL (ref 30.0–36.0)
MCV: 85.7 fL (ref 80.0–100.0)
Platelets: 161 10*3/uL (ref 150–400)
RBC: 4.54 MIL/uL (ref 4.22–5.81)
RDW: 12.3 % (ref 11.5–15.5)
WBC: 4.5 10*3/uL (ref 4.0–10.5)
nRBC: 0 % (ref 0.0–0.2)

## 2022-02-11 LAB — BASIC METABOLIC PANEL
Anion gap: 5 (ref 5–15)
BUN: 13 mg/dL (ref 6–20)
CO2: 22 mmol/L (ref 22–32)
Calcium: 8.2 mg/dL — ABNORMAL LOW (ref 8.9–10.3)
Chloride: 112 mmol/L — ABNORMAL HIGH (ref 98–111)
Creatinine, Ser: 0.71 mg/dL (ref 0.61–1.24)
GFR, Estimated: >60 mL/min (ref >60)
Glucose, Bld: 129 mg/dL — ABNORMAL HIGH (ref 70–99)
Potassium: 3.5 mmol/L (ref 3.5–5.1)
Sodium: 139 mmol/L (ref 135–145)

## 2022-02-11 LAB — LIPID PANEL
Cholesterol: 95 mg/dL (ref 0–200)
HDL: 35 mg/dL — ABNORMAL LOW (ref >40)
LDL Cholesterol: 48 mg/dL (ref 0–99)
Total CHOL/HDL Ratio: 2.7 RATIO
Triglycerides: 62 mg/dL (ref <150)
VLDL: 12 mg/dL (ref 0–40)

## 2022-02-11 LAB — GLUCOSE, CAPILLARY
Glucose-Capillary: 180 mg/dL — ABNORMAL HIGH (ref 70–99)
Glucose-Capillary: 306 mg/dL — ABNORMAL HIGH (ref 70–99)

## 2022-02-11 MED ORDER — ACETAMINOPHEN 500 MG PO TABS: 1000.0000 mg | ORAL_TABLET | Freq: Four times a day (QID) | ORAL | Status: DC | PRN

## 2022-02-11 MED ORDER — LIVING WELL WITH DIABETES BOOK
Freq: Once | Status: DC
Start: 2022-02-11 — End: 2022-02-11
  Filled 2022-02-11: qty 1

## 2022-02-11 MED ORDER — ACETAMINOPHEN 650 MG RE SUPP
650.0000 mg | Freq: Four times a day (QID) | RECTAL | Status: DC | PRN
Start: 2022-02-11 — End: 2022-02-11

## 2022-02-11 MED ORDER — METHOCARBAMOL 500 MG PO TABS
500.0000 mg | ORAL_TABLET | Freq: Once | ORAL | Status: AC
  Administered 2022-02-11: 500 mg via ORAL
  Filled 2022-02-11: qty 1

## 2022-02-11 NOTE — Hospital Course (Addendum)
56 year old male with past medical history of hypertension, CAD, obstructive sleep apnea and diabetes mellitus who presented to the emergency room on the early morning hours of 6/29 with intermittent chest pain x1 week.  Recent Myoview in March 2023 noted normal perfusion without any ischemia and slightly decreased ejection fraction of 45 to 50%.  EKG noted normal sinus rhythm with PACs that were new, but otherwise no ischemia noted.  Troponin x3 negative.  Patient brought in for further evaluation.  Chest pain had resolved with morphine and nitroglycerin.  Seen by cardiology who felt given recent stress test and negative for acute findings during this hospitalization, patient will be cleared.  There was a recommendation for increasing Imdur from 30 mg to 60 mg and adding beta-blocker, however patient's blood pressure remained soft during hospitalization with a systolic in the 90s so therefore outpatient follow-up only recommended

## 2022-02-11 NOTE — Discharge Summary (Signed)
Physician Discharge Summary   Patient: Miguel Walters MRN: [id number] DOB: Dec 12, 1965  Admit date:     02/10/2022  Discharge date: 02/11/22  Discharge Physician: Hollice EspySendil K Keefer Soulliere   PCP: Shane CrutchMarkley, Linda, PA   Recommendations at discharge:   Patient will follow with cardiology as outpatient Recommendations to increase Imdur from 30 mg to 60 mg and add beta-blocker were held due to soft blood pressure.  Discharge Diagnoses: Principal Problem:   Chest pain Active Problems:   CAD (coronary artery disease)   Hypertension   Diabetes mellitus without complication (HCC)   Hypokalemia   Hyperlipidemia   Overweight (BMI 25.0-29.9)  Resolved Problems:   * No resolved hospital problems. *  Hospital Course: 56 year old male with past medical history of hypertension, CAD, obstructive sleep apnea and diabetes mellitus who presented to the emergency room on the early morning hours of 6/29 with intermittent chest pain x1 week.  Recent Myoview in March 2023 noted normal perfusion without any ischemia and slightly decreased ejection fraction of 45 to 50%.  EKG noted normal sinus rhythm with PACs that were new, but otherwise no ischemia noted.  Troponin x3 negative.  Patient brought in for further evaluation.  Chest pain had resolved with morphine and nitroglycerin.  Seen by cardiology who felt given recent stress test and negative for acute findings during this hospitalization, patient will be cleared.  There was a recommendation for increasing Imdur from 30 mg to 60 mg and adding beta-blocker, however patient's blood pressure remained soft during hospitalization with a systolic in the 90s so therefore outpatient follow-up only recommended  Assessment and Plan: * Chest pain Urine drug screen unremarkable.  Chest pain resolved.  Troponin x3 and EKG unremarkable.  Seen by cardiology and cleared.  Chest pain felt to be noncardiac in nature.       CAD (coronary artery disease) -see  above  Hypertension Continued on lisinopril and Imdur.  Recommendation was for increasing Imdur from 30 mg to 60 mg as well as adding beta-blocker but this was not able to be initiated due to soft blood pressure with systolic in the 90s.  Diabetes mellitus without complication (HCC) Recent A1c 7.1, Patient still taking Jardiance, Levemir 50 units twice daily.  He stated that he had been taken off of Janumet -Sliding scale insulin during hospitalization   Hypokalemia Potassium is 3.4 -Repleted potassium -Check magnesium level --> 1.9  Hyperlipidemia -lipitor  Overweight (BMI 25.0-29.9) Meets criteria with BMI greater than 25         Consultants: Cardiology Procedures performed: None Disposition: Home Diet recommendation:  Discharge Diet Orders (From admission, onward)     Start     Ordered   02/11/22 0000  Diet - low sodium heart healthy        02/11/22 1237           Cardiac and Carb modified diet DISCHARGE MEDICATION: Allergies as of 02/11/2022       Reactions   Codeine    Oxycodone Itching   Hydrocodone Rash        Medication List     STOP taking these medications    Janumet XR 50-1000 MG Tb24 Generic drug: SitaGLIPtin-MetFORMIN HCl       TAKE these medications    aspirin 81 MG chewable tablet Chew 81 mg by mouth daily.   atorvastatin 40 MG tablet Commonly known as: LIPITOR Take 40 mg by mouth at bedtime.   isosorbide mononitrate 30 MG 24 hr tablet Commonly known as:  IMDUR Take 30 mg by mouth daily.   Januvia 100 MG tablet Generic drug: sitaGLIPtin Take 100 mg by mouth daily.   Jardiance 25 MG Tabs tablet Generic drug: empagliflozin Take 25 mg by mouth daily.   Levemir FlexPen 100 UNIT/ML FlexPen Generic drug: insulin detemir Inject 50 Units into the skin 2 (two) times daily.   lisinopril 2.5 MG tablet Commonly known as: ZESTRIL Take 2.5 mg by mouth daily.   metFORMIN 1000 MG tablet Commonly known as: GLUCOPHAGE Take  1,000 mg by mouth 2 (two) times daily.   methocarbamol 500 MG tablet Commonly known as: ROBAXIN Take 500 mg by mouth 3 (three) times daily.   omeprazole 20 MG capsule Commonly known as: PRILOSEC Take 20 mg by mouth daily.        Follow-up Information     Alwyn Pea, MD. Go in 1 week(s).   Specialties: Cardiology, Internal Medicine Why: Appointment on Thursday 02/17/2022 at 10:45am. Contact information: [address] [place] [address] [phone]         Shane Crutch, [place] Follow up.   Specialty: Family Medicine Why: As needed Contact information: [address] [place] [address] [phone]                Discharge Exam: Ceasar Mons Weights   02/11/22 0005  Weight: 81.1 kg   General: Alert and oriented x3, no acute distress Cardiovascular: Regular rate and rhythm, S1-S2 Lungs: Clear to auscultation bilaterally  Condition at discharge: good  The results of significant diagnostics from this hospitalization (including imaging, microbiology, ancillary and laboratory) are listed below for reference.   Imaging Studies: ECHOCARDIOGRAM COMPLETE  Result Date: 02/11/2022    ECHOCARDIOGRAM REPORT   Patient Name:   Miguel Walters Date of Exam: 02/10/2022 Medical Rec #:  [id number]       Height:       67.0 in Accession #:    [id number]      Weight:       184.0 lb Date of Birth:  July 02, 1966       BSA:          1.952 m Patient Age:    55 years        BP:           101/72 mmHg Patient Gender: M               HR:           84 bpm. Exam Location:  [facility] Procedure: 2D Echo, Color Doppler, Cardiac Doppler and Intracardiac            Opacification Agent Indications:     R07.9 Chest Pain  History:         Patient has no prior history of Echocardiogram examinations.                  Risk Factors:Hypertension, Diabetes, Dyslipidemia and Sleep                  Apnea.  Sonographer:     Humphrey Rolls Referring Phys:  [id number] [place] MICHELLE TANG Diagnosing Phys:  Marcina Millard MD  Sonographer Comments: Suboptimal apical window. IMPRESSIONS  1. Left ventricular ejection fraction, by estimation, is 55 to 60%. The left ventricle has normal function. The left ventricle has no regional wall motion abnormalities. Left ventricular diastolic parameters were normal.  2. Right ventricular systolic function is normal. The right ventricular size is normal.  3. The mitral valve is  normal in structure. Trivial mitral valve regurgitation. No evidence of mitral stenosis.  4. The aortic valve is normal in structure. Aortic valve regurgitation is not visualized. No aortic stenosis is present.  5. The inferior vena cava is normal in size with greater than 50% respiratory variability, suggesting right atrial pressure of 3 mmHg. FINDINGS  Left Ventricle: Left ventricular ejection fraction, by estimation, is 55 to 60%. The left ventricle has normal function. The left ventricle has no regional wall motion abnormalities. Definity contrast agent was given IV to delineate the left ventricular  endocardial borders. The left ventricular internal cavity size was normal in size. There is no left ventricular hypertrophy. Left ventricular diastolic parameters were normal. Right Ventricle: The right ventricular size is normal. No increase in right ventricular wall thickness. Right ventricular systolic function is normal. Left Atrium: Left atrial size was normal in size. Right Atrium: Right atrial size was normal in size. Pericardium: There is no evidence of pericardial effusion. Mitral Valve: The mitral valve is normal in structure. Trivial mitral valve regurgitation. No evidence of mitral valve stenosis. Tricuspid Valve: The tricuspid valve is normal in structure. Tricuspid valve regurgitation is trivial. No evidence of tricuspid stenosis. Aortic Valve: The aortic valve is normal in structure. Aortic valve regurgitation is not visualized. No aortic stenosis is present. Aortic valve mean gradient  measures 3.0 mmHg. Aortic valve peak gradient measures 5.3 mmHg. Aortic valve area, by VTI measures 2.16 cm. Pulmonic Valve: The pulmonic valve was normal in structure. Pulmonic valve regurgitation is not visualized. No evidence of pulmonic stenosis. Aorta: The aortic root is normal in size and structure. Venous: The inferior vena cava is normal in size with greater than 50% respiratory variability, suggesting right atrial pressure of 3 mmHg. IAS/Shunts: No atrial level shunt detected by color flow Doppler.  LEFT VENTRICLE PLAX 2D LVIDd:         4.74 cm      Diastology LVIDs:         3.40 cm      LV e' medial:    7.83 cm/s LV PW:         1.00 cm      LV E/e' medial:  7.6 LV IVS:        0.74 cm      LV e' lateral:   10.70 cm/s LVOT diam:     1.80 cm      LV E/e' lateral: 5.6 LV SV:         43 LV SV Index:   22 LVOT Area:     2.54 cm  LV Volumes (MOD) LV vol d, MOD A2C: 77.7 ml LV vol d, MOD A4C: 108.0 ml LV vol s, MOD A2C: 33.2 ml LV vol s, MOD A4C: 41.9 ml LV SV MOD A2C:     44.5 ml LV SV MOD A4C:     108.0 ml LV SV MOD BP:      61.8 ml RIGHT VENTRICLE RV Basal diam:  2.56 cm TAPSE (M-mode): 2.3 cm LEFT ATRIUM         Index       RIGHT ATRIUM          Index LA diam:    2.50 cm 1.28 cm/m  RA Area:     9.04 cm                                 RA Volume:  18.50 ml 9.48 ml/m  AORTIC VALVE                    PULMONIC VALVE AV Area (Vmax):    2.05 cm     PV Vmax:       1.04 m/s AV Area (Vmean):   2.05 cm     PV Peak grad:  4.3 mmHg AV Area (VTI):     2.16 cm AV Vmax:           115.00 cm/s AV Vmean:          80.900 cm/s AV VTI:            0.199 m AV Peak Grad:      5.3 mmHg AV Mean Grad:      3.0 mmHg LVOT Vmax:         92.70 cm/s LVOT Vmean:        65.100 cm/s LVOT VTI:          0.169 m LVOT/AV VTI ratio: 0.85  AORTA Ao Root diam: 3.20 cm MITRAL VALVE MV Area (PHT): 4.33 cm    SHUNTS MV Decel Time: 175 msec    Systemic VTI:  0.17 m MV E velocity: 59.60 cm/s  Systemic Diam: 1.80 cm MV A velocity: 57.40 cm/s MV E/A  ratio:  1.04 Marcina Millard MD Electronically signed by Marcina Millard MD Signature Date/Time: 02/11/2022/8:33:49 AM    Final    DG Chest 2 View  Result Date: 02/10/2022 CLINICAL DATA:  Chest pain, chest tightness, shortness of breath. EXAM: CHEST - 2 VIEW COMPARISON:  09/11/2021 FINDINGS: Normal heart size and pulmonary vascularity. No focal airspace disease or consolidation in the lungs. No blunting of costophrenic angles. No pneumothorax. Mediastinal contours appear intact. Mild pectus excavatum. IMPRESSION: No active cardiopulmonary disease. Electronically Signed   By: Burman Nieves M.D.   On: 02/10/2022 03:31    Microbiology: Results for orders placed or performed during the hospital encounter of 09/12/21  Resp Panel by RT-PCR (Flu A&B, Covid) Nasopharyngeal Swab     Status: None   Collection Time: 09/11/21 11:38 PM   Specimen: Nasopharyngeal Swab; Nasopharyngeal(NP) swabs in vial transport medium  Result Value Ref Range Status   SARS Coronavirus 2 by RT PCR NEGATIVE NEGATIVE Final    Comment: (NOTE) SARS-CoV-2 target nucleic acids are NOT DETECTED.  The SARS-CoV-2 RNA is generally detectable in upper respiratory specimens during the acute phase of infection. The lowest concentration of SARS-CoV-2 viral copies this assay can detect is 138 copies/mL. A negative result does not preclude SARS-Cov-2 infection and should not be used as the sole basis for treatment or other patient management decisions. A negative result may occur with  improper specimen collection/handling, submission of specimen other than nasopharyngeal swab, presence of viral mutation(s) within the areas targeted by this assay, and inadequate number of viral copies(<138 copies/mL). A negative result must be combined with clinical observations, patient history, and epidemiological information. The expected result is Negative.  Fact Sheet for Patients:  BloggerCourse.com  Fact  Sheet for Healthcare Providers:  SeriousBroker.it  This test is no t yet approved or cleared by the [place] FDA and  has been authorized for detection and/or diagnosis of SARS-CoV-2 by FDA under an Emergency Use Authorization (EUA). This EUA will remain  in effect (meaning this test can be used) for the duration of the COVID-19 declaration under Section 564(b)(1) of the Act, 21 U.S.C.section 360bbb-3(b)(1), unless the authorization is terminated  or revoked  sooner.       Influenza A by PCR NEGATIVE NEGATIVE Final   Influenza B by PCR NEGATIVE NEGATIVE Final    Comment: (NOTE) The Xpert Xpress SARS-CoV-2/FLU/RSV plus assay is intended as an aid in the diagnosis of influenza from Nasopharyngeal swab specimens and should not be used as a sole basis for treatment. Nasal washings and aspirates are unacceptable for Xpert Xpress SARS-CoV-2/FLU/RSV testing.  Fact Sheet for Patients: BloggerCourse.com  Fact Sheet for Healthcare Providers: SeriousBroker.it  This test is not yet approved or cleared by the [place] FDA and has been authorized for detection and/or diagnosis of SARS-CoV-2 by FDA under an Emergency Use Authorization (EUA). This EUA will remain in effect (meaning this test can be used) for the duration of the COVID-19 declaration under Section 564(b)(1) of the Act, 21 U.S.C. section 360bbb-3(b)(1), unless the authorization is terminated or revoked.  Performed at [facility], [address] [place], [place] [address]     Labs: CBC: Recent Labs  Lab 02/10/22 0301 02/11/22 0529  WBC 7.9 4.5  HGB 15.4 13.3  HCT 44.8 38.9*  MCV 85.3 85.7  PLT 208 161   Basic Metabolic Panel: Recent Labs  Lab 02/10/22 0301 02/10/22 0500 02/11/22 0529  NA 138  --  139  K 3.4*  --  3.5  CL 109  --  112*  CO2 19*  --  22  GLUCOSE 198*  --  129*  BUN 16  --  13  CREATININE 0.91  --   0.71  CALCIUM 9.1  --  8.2*  MG  --  1.9  --    Liver Function Tests: No results for input(s): "AST", "ALT", "ALKPHOS", "BILITOT", "PROT", "ALBUMIN" in the last 168 hours. CBG: Recent Labs  Lab 02/10/22 1112 02/10/22 1659 02/10/22 2220 02/11/22 0745 02/11/22 1124  GLUCAP 150* 270* 325* 180* 306*    Discharge time spent: less than 30 minutes.  Signed: Hollice Espy, MD [facility] 02/11/2022

## 2022-02-11 NOTE — Assessment & Plan Note (Signed)
Meets criteria with BMI greater than 25 

## 2022-02-11 NOTE — Inpatient Diabetes Management (Signed)
Inpatient Diabetes Program Recommendations  AACE/ADA: New Consensus Statement on Inpatient Glycemic Control (2015)  Target Ranges:  Prepandial:   less than 140 mg/dL      Peak postprandial:   less than 180 mg/dL (1-2 hours)      Critically ill patients:  140 - 180 mg/dL   Lab Results  Component Value Date   GLUCAP 306 (H) 02/11/2022   HGBA1C 10.2 (H) 02/10/2022    Review of Glycemic Control  Latest Reference Range & Units 02/10/22 16:59 02/10/22 22:20 02/11/22 07:45 02/11/22 11:24  Glucose-Capillary 70 - 99 mg/dL 409 (H) 811 (H) 914 (H) 306 (H)  (H): Data is abnormally high  Diabetes history: DM2 Outpatient Diabetes medications: Levemir 50 units bid, Januvia 100 mg qd, Jardiance 25 mg qd, Metformin 1 gm bid Current orders for Inpatient glycemic control: Levemir 60 units bid, Novolog 0-9 units correction + hs 0-5   Inpatient Diabetes Program Recommendations:   Spoke with pt about A1C results 10.2 (average blood glucose 246 over the past 2-3 months) and explained what an A1C is, basic pathophysiology of DM Type 2, basic home care, basic diabetes diet nutrition principles, importance of checking CBGs and maintaining good CBG control to prevent long-term and short-term complications. Reviewed signs and symptoms of hyperglycemia and hypoglycemia and how to treat hypoglycemia at home. Also reviewed blood sugar goals at home.  Ordered Living Well With Diabetes booklet for patient and wife to review.  Patient states his fasting blood glucose has been running low @ 80 so eats a honey bun each morning "to get my sugar up". Reviewed with patient to check CBGs qid and take meter or log of CBGs to MD appointments for medication management. Patient has been drinking some sugary drinks and mostly eats in the evening after getting home from mowing lawns. Discussed healthy options for meals and discussed plate method. Patient shared that he and his wife have been discussing that they need to eat  healthier andmake dietary changes.  Thank you, Billy Fischer. Shamari Trostel, RN, MSN, CDE  Diabetes Coordinator Inpatient Glycemic Control Team Team Pager [phone] (8am-5pm) 02/11/2022 12:33 PM

## 2022-02-11 NOTE — Progress Notes (Signed)
Discharge instructions (including medications and follow up appointments) discussed with and copy provided to patient/caregiver 

## 2022-02-11 NOTE — TOC Initial Note (Signed)
Transition of Care [facility]) - Initial/Assessment Note    Patient Details  Name: Miguel Walters MRN: [id number] Date of Birth: 06/26/1966  Transition of Care [facility]) CM/SW Contact:    Truddie Hidden, RN Phone Number: 02/11/2022, 10:45 AM  Clinical Narrative:                   Transition of Care [facility]) Screening Note   Patient Details  Name: Miguel Walters Date of Birth: Dec 18, 1965   Transition of Care [facility]) CM/SW Contact:    Truddie Hidden, RN Phone Number: 02/11/2022, 10:45 AM    Transition of Care Department [facility]) has reviewed patient and no TOC needs have been identified at this time. We will continue to monitor patient advancement through interdisciplinary progression rounds. If new patient transition needs arise, please place a TOC consult.         Patient Goals and CMS Choice        Expected Discharge Plan and Services                                                Prior Living Arrangements/Services                       Activities of Daily Living Home Assistive Devices/Equipment: CBG Meter, CPAP ADL Screening (condition at time of admission) Patient's cognitive ability adequate to safely complete daily activities?: Yes Is the patient deaf or have difficulty hearing?: No Does the patient have difficulty seeing, even when wearing glasses/contacts?: No Does the patient have difficulty concentrating, remembering, or making decisions?: No Patient able to express need for assistance with ADLs?: Yes Does the patient have difficulty dressing or bathing?: No Independently performs ADLs?: Yes (appropriate for developmental age) Does the patient have difficulty walking or climbing stairs?: No Weakness of Legs: None Weakness of Arms/Hands: None  Permission Sought/Granted                  Emotional Assessment              Admission diagnosis:  Unstable angina (HCC) [I20.0] Chest pain [R07.9] Patient Active Problem List    Diagnosis Date Noted   Chest pain 02/10/2022   CAD (coronary artery disease) 02/10/2022   Diabetes mellitus without complication (HCC)    Hypokalemia    Atypical chest pain 07/02/2020   Hypertension 07/02/2020   Murmur 07/02/2020   Plantar fasciitis, left 07/30/2014   Hypomagnesemia 01/27/2014   Leg cramp 01/27/2014   Diabetes mellitus (HCC) 11/22/2013   Hyperlipidemia 11/22/2013   OSA (obstructive sleep apnea) 11/22/2013   Keloid 11/08/2013   PCP:  Shane Crutch, PA Pharmacy:   [facility] [place], [place] - [address] [place] [address] Phone: [phone] Fax: [phone]  [facility] - [place], [place] - [address] [address] [place] [address] Phone: [phone] Fax: [phone]  [id number] [place], [place] - [address] AT [facility] [address] [place] [address] Phone: [phone] Fax: [phone]     Social Determinants of Health (SDOH) Interventions    Readmission Risk Interventions     No data to display

## 2022-12-18 ENCOUNTER — Other Ambulatory Visit: Payer: Self-pay

## 2022-12-18 ENCOUNTER — Emergency Department: Payer: BC Managed Care – PPO

## 2022-12-18 ENCOUNTER — Emergency Department
Admission: EM | Admit: 2022-12-18 | Discharge: 2022-12-18 | Disposition: A | Discharge status: Home / Self Care | Payer: BC Managed Care – PPO | Attending: Emergency Medicine | Admitting: Emergency Medicine

## 2022-12-18 DIAGNOSIS — Y990 Civilian activity done for income or pay: Secondary | ICD-10-CM | POA: Diagnosis not present

## 2022-12-18 DIAGNOSIS — E119 Type 2 diabetes mellitus without complications: Secondary | ICD-10-CM | POA: Insufficient documentation

## 2022-12-18 DIAGNOSIS — W1843XA Slipping, tripping and stumbling without falling due to stepping from one level to another, initial encounter: Secondary | ICD-10-CM | POA: Diagnosis not present

## 2022-12-18 DIAGNOSIS — S9032XA Contusion of left foot, initial encounter: Secondary | ICD-10-CM | POA: Insufficient documentation

## 2022-12-18 DIAGNOSIS — M79672 Pain in left foot: Secondary | ICD-10-CM | POA: Diagnosis present

## 2022-12-18 NOTE — ED Provider Notes (Signed)
[facility] Provider Note    Event Date/Time   First MD Initiated Contact with Patient 12/18/22 [phone]     (approximate)   History   Foot Injury   HPI  Miguel Walters is a 57 y.o. male with history of diabetes presents emergency department complaint of left foot pain for about 2 weeks.  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  Still having pain.  Has not taken Tylenol or ibuprofen.  States he thought it would go away but it continues to hurt.  States still has some swelling at night.      Physical Exam   Triage Vital Signs: ED Triage Vitals [12/18/22 0818]  Enc Vitals Group     BP 135/87     Pulse Rate 75     Resp 16     Temp 98.4 F (36.9 C)     Temp Source Oral     SpO2 96 %     Weight 182 lb (82.6 kg)     Height 5\' 7"  (1.702 m)     Head Circumference      Peak Flow      Pain Score 5     Pain Loc      Pain Edu?      Excl. in GC?     Most recent vital signs: Vitals:   12/18/22 0818  BP: 135/87  Pulse: 75  Resp: 16  Temp: 98.4 F (36.9 C)  SpO2: 96%     General: Awake, no distress.   CV:  Good peripheral perfusion. regular rate and  rhythm Resp:  Normal effort. L Abd:  No distention.   Other:  Left foot is tender along the first metatarsal, minimal swelling noted, no redness, neurovascular intact   ED Results / Procedures / Treatments   Labs (all labs ordered are listed, but only abnormal results are displayed) Labs Reviewed - No data to display   EKG     RADIOLOGY X-ray of the left foot    PROCEDURES:   Procedures   MEDICATIONS ORDERED IN ED: Medications - No data to display   IMPRESSION / MDM / ASSESSMENT AND PLAN / ED COURSE  I reviewed the triage vital signs and the nursing notes.                              Differential diagnosis includes, but is not limited to, contusion, sprain, fracture  Patient's presentation is most  consistent with acute complicated illness / injury requiring diagnostic workup.   X-ray of the left foot  I did independently review and interpret x-ray of the left foot as being negative for any acute abnormality, confirmed by radiology  Did explain the findings to the patient.  Feel this is a contusion/sprain.  He was placed in Ace wrap and postop shoe to hopefully decrease inflammation on the tendons.  Did explain to him that him being diabetic and it will take his foot longer to heal.  He should follow-up with podiatry.  He is in agreement treatment plan.  Discharged stable condition.      FINAL CLINICAL IMPRESSION(S) / ED DIAGNOSES   Final diagnoses:  Contusion of left foot, initial encounter     Rx / DC Orders   ED Discharge Orders     None  Note:  This document was prepared using Dragon voice recognition software and may include unintentional dictation errors.    Faythe Ghee, PA-C 12/18/22 4098    Merwyn Katos, MD 12/18/22 [phone]

## 2022-12-18 NOTE — Discharge Instructions (Signed)
Take Tylenol and ibuprofen for pain as needed.  Put ice on the foot after work.  Wear the postop shoe unless you are at work.  Return emergency department worsening.  See podiatry if not improving in 1 week =will need to call and make an appointment

## 2022-12-18 NOTE — ED Triage Notes (Signed)
Pt reports injuring his left foot 2 weeks ago, stepping down from a step at work and is still having pain so he wanted to be seen.

## 2023-03-06 LAB — EXTERNAL GENERIC LAB PROCEDURE

## 2023-07-15 ENCOUNTER — Emergency Department
Admission: EM | Admit: 2023-07-15 | Discharge: 2023-07-15 | Disposition: A | Discharge status: Home / Self Care | Payer: BC Managed Care – PPO | Attending: Emergency Medicine | Admitting: Emergency Medicine

## 2023-07-15 ENCOUNTER — Other Ambulatory Visit: Payer: Self-pay

## 2023-07-15 DIAGNOSIS — E1165 Type 2 diabetes mellitus with hyperglycemia: Secondary | ICD-10-CM | POA: Diagnosis not present

## 2023-07-15 DIAGNOSIS — R739 Hyperglycemia, unspecified: Secondary | ICD-10-CM | POA: Diagnosis present

## 2023-07-15 DIAGNOSIS — I10 Essential (primary) hypertension: Secondary | ICD-10-CM | POA: Insufficient documentation

## 2023-07-15 DIAGNOSIS — I2510 Atherosclerotic heart disease of native coronary artery without angina pectoris: Secondary | ICD-10-CM | POA: Diagnosis not present

## 2023-07-15 LAB — CBC
HCT: 44.5 % (ref 39.0–52.0)
Hemoglobin: 15.4 g/dL (ref 13.0–17.0)
MCH: 28.7 pg (ref 26.0–34.0)
MCHC: 34.6 g/dL (ref 30.0–36.0)
MCV: 82.9 fL (ref 80.0–100.0)
Platelets: 204 10*3/uL (ref 150–400)
RBC: 5.37 MIL/uL (ref 4.22–5.81)
RDW: 12.3 % (ref 11.5–15.5)
WBC: 5.3 10*3/uL (ref 4.0–10.5)
nRBC: 0 % (ref 0.0–0.2)

## 2023-07-15 LAB — URINALYSIS, ROUTINE W REFLEX MICROSCOPIC
Bacteria, UA: NONE SEEN
Bilirubin Urine: NEGATIVE
Glucose, UA: >=500 mg/dL — AB
Hgb urine dipstick: NEGATIVE
Ketones, ur: 20 mg/dL — AB
Leukocytes,Ua: NEGATIVE
Nitrite: NEGATIVE
Protein, ur: NEGATIVE mg/dL
Specific Gravity, Urine: 1.022 (ref 1.005–1.030)
pH: 7 (ref 5.0–8.0)

## 2023-07-15 LAB — CBG MONITORING, ED
Glucose-Capillary: 267 mg/dL — ABNORMAL HIGH (ref 70–99)
Glucose-Capillary: 155 mg/dL — ABNORMAL HIGH (ref 70–99)

## 2023-07-15 LAB — BASIC METABOLIC PANEL
Anion gap: 7 (ref 5–15)
BUN: 15 mg/dL (ref 6–20)
CO2: 22 mmol/L (ref 22–32)
Calcium: 8.1 mg/dL — ABNORMAL LOW (ref 8.9–10.3)
Chloride: 104 mmol/L (ref 98–111)
Creatinine, Ser: 0.8 mg/dL (ref 0.61–1.24)
GFR, Estimated: >60 mL/min (ref >60)
Glucose, Bld: 307 mg/dL — ABNORMAL HIGH (ref 70–99)
Potassium: 3.8 mmol/L (ref 3.5–5.1)
Sodium: 133 mmol/L — ABNORMAL LOW (ref 135–145)

## 2023-07-15 MED ORDER — ACETAMINOPHEN 325 MG PO TABS
650.0000 mg | ORAL_TABLET | Freq: Once | ORAL | Status: AC
  Administered 2023-07-15: 650 mg via ORAL
  Filled 2023-07-15: qty 2

## 2023-07-15 MED ORDER — SODIUM CHLORIDE 0.9 % IV SOLN: Freq: Once | INTRAVENOUS | Status: AC

## 2023-07-15 NOTE — ED Provider Notes (Signed)
[facility] Provider Note    Event Date/Time   First MD Initiated Contact with Patient 07/15/23 1242     (approximate)   History   Hyperglycemia   HPI  Miguel Walters is a 57 y.o. male with a past medical history of diabetes, CAD, who presents today for evaluation of hyperglycemia that he reports has been ongoing all week.  He reports that today and is showing 375.  Patient reports that he has been off from work this week because of the holiday and thinks that his eating patterns have changed.  He reports that he takes his insulin once per day, at nighttime.  He denies any symptoms.  No nausea, vomiting, diarrhea, abdominal pain, fevers, chills, infectious type symptoms, or any other complaints today.  Patient Active Problem List   Diagnosis Date Noted   Overweight (BMI 25.0-29.9) 02/11/2022   Chest pain 02/10/2022   CAD (coronary artery disease) 02/10/2022   Diabetes mellitus without complication (HCC)    Hypokalemia    Atypical chest pain 07/02/2020   Hypertension 07/02/2020   Murmur 07/02/2020   Plantar fasciitis, left 07/30/2014   Hypomagnesemia 01/27/2014   Leg cramp 01/27/2014   Diabetes mellitus (HCC) 11/22/2013   Hyperlipidemia 11/22/2013   OSA (obstructive sleep apnea) 11/22/2013   Keloid 11/08/2013          Physical Exam   Triage Vital Signs: ED Triage Vitals  Encounter Vitals Group     BP 07/15/23 1233 (!) 154/102     Systolic BP Percentile --      Diastolic BP Percentile --      Pulse Rate 07/15/23 1233 (!) 59     Resp 07/15/23 1233 18     Temp 07/15/23 1233 98.5 F (36.9 C)     Temp src --      SpO2 07/15/23 1233 98 %     Weight --      Height --      Head Circumference --      Peak Flow --      Pain Score 07/15/23 1231 0     Pain Loc --      Pain Education --      Exclude from Growth Chart --     Most recent vital signs: Vitals:   07/15/23 1233 07/15/23 1456  BP: (!) 154/102 (!) 153/93  Pulse: (!) 59 64   Resp: 18 20  Temp: 98.5 F (36.9 C)   SpO2: 98% 98%    Physical Exam Vitals and nursing note reviewed.  Constitutional:      General: Awake and alert. No acute distress.    Appearance: Normal appearance. The patient is normal weight.  HENT:     Head: Normocephalic and atraumatic.     Mouth: Mucous membranes are moist.  Obvious gingivitis to his left upper teeth with decay of some of the posterior molars.  No gingival fluctuance.  No sublingual swelling.  No trismus.  No drooling Eyes:     General: PERRL. Normal EOMs        Right eye: No discharge.        Left eye: No discharge.     Conjunctiva/sclera: Conjunctivae normal.  Cardiovascular:     Rate and Rhythm: Normal rate and regular rhythm.     Pulses: Normal pulses.  Pulmonary:     Effort: Pulmonary effort is normal. No respiratory distress.     Breath sounds: Normal breath sounds.  Abdominal:  Abdomen is soft. There is no abdominal tenderness. No rebound or guarding. No distention. Musculoskeletal:        General: No swelling. Normal range of motion.     Cervical back: Normal range of motion and neck supple.  Skin:    General: Skin is warm and dry.     Capillary Refill: Capillary refill takes less than 2 seconds.     Findings: No rash.  Neurological:     Mental Status: The patient is awake and alert.      ED Results / Procedures / Treatments   Labs (all labs ordered are listed, but only abnormal results are displayed) Labs Reviewed  BASIC METABOLIC PANEL - Abnormal; Notable for the following components:      Result Value   Sodium 133 (*)    Glucose, Bld 307 (*)    Calcium 8.1 (*)    All other components within normal limits  URINALYSIS, ROUTINE W REFLEX MICROSCOPIC - Abnormal; Notable for the following components:   Color, Urine STRAW (*)    APPearance CLEAR (*)    Glucose, UA >=500 (*)    Ketones, ur 20 (*)    All other components within normal limits  CBG MONITORING, ED - Abnormal; Notable for the  following components:   Glucose-Capillary 267 (*)    All other components within normal limits  CBG MONITORING, ED - Abnormal; Notable for the following components:   Glucose-Capillary 155 (*)    All other components within normal limits  CBC     EKG     RADIOLOGY I independently reviewed and interpreted imaging and agree with radiologists findings.     PROCEDURES:  Critical Care performed:   Procedures   MEDICATIONS ORDERED IN ED: Medications  0.9 %  sodium chloride infusion (0 mLs Intravenous Stopped 07/15/23 1442)  acetaminophen (TYLENOL) tablet 650 mg (650 mg Oral Given 07/15/23 1417)     IMPRESSION / MDM / ASSESSMENT AND PLAN / ED COURSE  I reviewed the triage vital signs and the nursing notes.   Differential diagnosis includes, but is not limited to, hyperglycemia, dietary changes, dehydration, DKA, HHS.  Patient is awake and alert, hemodynamically stable and afebrile.  He is nontoxic in appearance.  Labs obtained and patient was given a liter of IV fluids.  Glucose is elevated to 307, normal bicarb, no increased anion gap, not consistent with DKA.  Recheck after 1 L of normal saline.  He did report that he had dental pain to his left upper teeth, on evaluation he has obvious gingivitis and dental decay, though no obvious area of fluctuance to suggest abscess.  No sublingual swelling or woodiness to suggest Ludwig's angina.  No drooling or trismus.  He was given Tylenol with good effect.  Blood sugar was rechecked after 1 L of normal saline and is 155.  Discussed importance of close outpatient follow-up with his primary care provider this week for recheck.  We also discussed dietary changes.  We also discussed strict return precautions.  Patient understands and agrees with plan.  He was discharged in stable condition.   Patient's presentation is most consistent with acute complicated illness / injury requiring diagnostic workup.  Clinical Course as of  07/15/23 1508  Sat Jul 15, 2023  1413 Patient is requesting Tylenol for left upper dental pain.  He has obvious gingivitis and dental caries [JP]    Clinical Course User Index [JP] Edita Weyenberg, Herb Grays, PA-C     FINAL CLINICAL IMPRESSION(S) / ED  DIAGNOSES   Final diagnoses:  Hyperglycemia     Rx / DC Orders   ED Discharge Orders     None        Note:  This document was prepared using Dragon voice recognition software and may include unintentional dictation errors.   Jackelyn Hoehn, PA-C 07/15/23 1508    Pilar Jarvis, MD 07/15/23 [phone]

## 2023-07-15 NOTE — ED Notes (Signed)
Blood drawn in triage

## 2023-07-15 NOTE — Discharge Instructions (Signed)
You are given IV fluids which helped bring her sugar down.  Please follow-up with your outpatient provider.  Please watch your diet.  Please return for any new, worsening, or change in symptoms or other concerns.  It was a pleasure caring for you today.

## 2023-07-15 NOTE — ED Triage Notes (Signed)
Pt comes with c/o hyperglycemia all week. Pt states he has meter and it has been reading high all week. Pt has been taking his insulin. Pt states today it is showing 375.

## 2023-07-15 NOTE — ED Notes (Signed)
Pt now asking for something for dental pain. Provider at bedside again.

## 2023-07-22 LAB — EXTERNAL GENERIC LAB PROCEDURE: COLOGUARD: NEGATIVE

## 2023-11-04 ENCOUNTER — Emergency Department

## 2023-11-04 ENCOUNTER — Other Ambulatory Visit: Payer: Self-pay

## 2023-11-04 DIAGNOSIS — J111 Influenza due to unidentified influenza virus with other respiratory manifestations: Secondary | ICD-10-CM | POA: Insufficient documentation

## 2023-11-04 DIAGNOSIS — R059 Cough, unspecified: Secondary | ICD-10-CM | POA: Diagnosis present

## 2023-11-04 DIAGNOSIS — J168 Pneumonia due to other specified infectious organisms: Secondary | ICD-10-CM | POA: Diagnosis not present

## 2023-11-04 NOTE — ED Triage Notes (Signed)
 Pt to ED by POV from home with c/o flu-like symptoms. Pt was diagnosed with the flu at urgent care on Wednesday. Pt states he was prescribed medication but that he isnt getting any better. Arrives A+O, VSS, NADN.

## 2023-11-05 ENCOUNTER — Emergency Department
Admission: EM | Admit: 2023-11-05 | Discharge: 2023-11-05 | Disposition: A | Discharge status: Home / Self Care | Attending: Emergency Medicine | Admitting: Emergency Medicine

## 2023-11-05 DIAGNOSIS — J111 Influenza due to unidentified influenza virus with other respiratory manifestations: Secondary | ICD-10-CM

## 2023-11-05 DIAGNOSIS — J189 Pneumonia, unspecified organism: Secondary | ICD-10-CM

## 2023-11-05 MED ORDER — AMOXICILLIN 500 MG PO CAPS
1000.0000 mg | ORAL_CAPSULE | Freq: Once | ORAL | Status: AC
  Administered 2023-11-05: 1000 mg via ORAL
  Filled 2023-11-05: qty 2

## 2023-11-05 MED ORDER — AMOXICILLIN 500 MG PO TABS: 1000.0000 mg | ORAL_TABLET | Freq: Three times a day (TID) | ORAL | 0 refills | Status: AC

## 2023-11-05 NOTE — Discharge Instructions (Signed)
 You were seen in the ER for your cough and bodyaches.  I suspect this is related to your recent flu infection.  It does look like you may have a small developing pneumonia.  I sent a prescription for antibiotics to your pharmacy.  Follow with your primary care doctor for further evaluation.  Return to the ER for new or worsening symptoms.

## 2023-11-05 NOTE — ED Provider Notes (Signed)
 [facility] Provider Note    Event Date/Time   First MD Initiated Contact with Patient 11/05/23 0008     (approximate)   History   Influenza   HPI  Miguel Walters is a 58 year old male presenting to the emergency room for evaluation of flulike symptoms.  Patient reports that on Monday he began to develop cough with associated body aches.  Has been progressive since that time.  Was seen as an outpatient on Wednesday and had positive flu testing.  He was placed on Tamiflu, cough syrup, and Tessalon Perles.  Reports some improvement in his cough, but reports ongoing bodyaches leading him to present to the ER.  No noted fevers.  Tolerating p.o. intake.     Physical Exam   Triage Vital Signs: ED Triage Vitals  Encounter Vitals Group     BP 11/04/23 2215 (!) 154/91     Systolic BP Percentile --      Diastolic BP Percentile --      Pulse Rate 11/04/23 2215 76     Resp 11/04/23 2215 18     Temp 11/04/23 2215 98 F (36.7 C)     Temp Source 11/04/23 2215 Oral     SpO2 11/04/23 2215 96 %     Weight 11/04/23 2202 198 lb (89.8 kg)     Height 11/04/23 2202 5\' 7"  (1.702 m)     Head Circumference --      Peak Flow --      Pain Score 11/04/23 2202 10     Pain Loc --      Pain Education --      Exclude from Growth Chart --     Most recent vital signs: Vitals:   11/04/23 2215 11/05/23 0123  BP: (!) 154/91   Pulse: 76   Resp: 18 15  Temp: 98 F (36.7 C) 98.3 F (36.8 C)  SpO2: 96%      General: Awake, interactive  CV:  Regular rate, good peripheral perfusion.  Resp:  Unlabored respirations, lungs clear to auscultation Abd:  Nondistended, soft, nontender to palpation Neuro:  Symmetric facial movement, fluid speech   ED Results / Procedures / Treatments   Labs (all labs ordered are listed, but only abnormal results are displayed) Labs Reviewed - No data to display   EKG EKG independently reviewed interpreted by myself (ER attending)  demonstrates:    RADIOLOGY Imaging independently reviewed and interpreted by myself demonstrates:  CXR with possible developing infiltrate along the lingula  PROCEDURES:  Critical Care performed: No  Procedures   MEDICATIONS ORDERED IN ED: Medications  amoxicillin (AMOXIL) capsule 1,000 mg (1,000 mg Oral Given 11/05/23 0130)     IMPRESSION / MDM / ASSESSMENT AND PLAN / ED COURSE  I reviewed the triage vital signs and the nursing notes.  Differential diagnosis includes, but is not limited to, sequela from known influenza infection, superimposed pneumonia, low suspicion other acute cardiopulmonary process  Patient's presentation is most consistent with acute illness / injury with system symptoms.  58 year old male presenting with cough and bodyaches in the setting of known influenza infection.  X-Miguel Walters here is concerning for possible small developing pneumonia.  No evidence of sepsis.  Patient well-appearing here.  Will start on amoxicillin.  Discussed continued supportive care measures.  Strict return precautions provided.      FINAL CLINICAL IMPRESSION(S) / ED DIAGNOSES   Final diagnoses:  Influenza  Pneumonia due to infectious organism, unspecified laterality, unspecified part of lung  Rx / DC Orders   ED Discharge Orders          Ordered    amoxicillin (AMOXIL) 500 MG tablet  3 times daily        11/05/23 4098             Note:  This document was prepared using Dragon voice recognition software and may include unintentional dictation errors.   Trinna Post, MD 11/05/23 [phone]

## 2024-03-11 ENCOUNTER — Other Ambulatory Visit: Payer: Self-pay

## 2024-03-11 MED ORDER — GLIPIZIDE ER 10 MG PO TB24: 10.0000 mg | ORAL_TABLET | Freq: Every morning | ORAL | 9 refills | Status: AC

## 2024-03-11 MED ORDER — OMNIPOD 5 DEXG7G6 PODS GEN 5 MISC
5 refills | Status: AC
  Filled 2024-03-11: qty 15, 30d supply, fill #0

## 2024-03-11 MED ORDER — ASPIRIN 81 MG PO CHEW: 81.0000 mg | CHEWABLE_TABLET | Freq: Every day | ORAL | 4 refills | Status: AC

## 2024-03-11 MED ORDER — TIZANIDINE HCL 2 MG PO TABS: ORAL_TABLET | ORAL | 1 refills | Status: AC

## 2024-03-11 MED ORDER — INSULIN ASPART 100 UNIT/ML IJ SOLN
75.0000 [IU] | Freq: Every day | INTRAMUSCULAR | 3 refills | Status: AC
  Filled 2024-03-11: qty 20, 26d supply, fill #0

## 2024-03-11 MED ORDER — METFORMIN HCL ER 500 MG PO TB24: 1000.0000 mg | ORAL_TABLET | Freq: Every day | ORAL | 3 refills | Status: AC

## 2024-03-11 MED ORDER — OMEPRAZOLE 20 MG PO CPDR: 20.0000 mg | DELAYED_RELEASE_CAPSULE | Freq: Every day | ORAL | 2 refills | Status: AC

## 2024-03-11 MED ORDER — DEXCOM G7 SENSOR MISC
1 refills | Status: AC
  Filled 2024-03-11: qty 9, 90d supply, fill #0

## 2024-03-11 MED ORDER — LOSARTAN POTASSIUM 25 MG PO TABS: 25.0000 mg | ORAL_TABLET | Freq: Every day | ORAL | 8 refills | Status: AC

## 2024-03-11 MED ORDER — EMPAGLIFLOZIN 25 MG PO TABS
25.0000 mg | ORAL_TABLET | Freq: Every day | ORAL | 1 refills | Status: AC
  Filled 2024-03-20: qty 90, 90d supply, fill #0

## 2024-03-12 ENCOUNTER — Other Ambulatory Visit: Payer: Self-pay

## 2024-03-18 ENCOUNTER — Other Ambulatory Visit: Payer: Self-pay

## 2024-03-19 ENCOUNTER — Other Ambulatory Visit: Payer: Self-pay

## 2024-03-20 ENCOUNTER — Other Ambulatory Visit: Payer: Self-pay

## 2024-03-22 ENCOUNTER — Other Ambulatory Visit: Payer: Self-pay
